# Patient Record
Sex: Female | Born: 1955
Health system: Southern US, Community
[De-identification: ages and names within clinical notes are randomized; demographics above are authoritative.]

## PROBLEM LIST (undated history)

## (undated) DIAGNOSIS — E785 Hyperlipidemia, unspecified: Secondary | ICD-10-CM

## (undated) DIAGNOSIS — Z872 Personal history of diseases of the skin and subcutaneous tissue: Secondary | ICD-10-CM

## (undated) DIAGNOSIS — E039 Hypothyroidism, unspecified: Secondary | ICD-10-CM

## (undated) DIAGNOSIS — J302 Other seasonal allergic rhinitis: Secondary | ICD-10-CM

## (undated) DIAGNOSIS — D369 Benign neoplasm, unspecified site: Secondary | ICD-10-CM

## (undated) DIAGNOSIS — M8580 Other specified disorders of bone density and structure, unspecified site: Secondary | ICD-10-CM

## (undated) DIAGNOSIS — I8390 Asymptomatic varicose veins of unspecified lower extremity: Secondary | ICD-10-CM

## (undated) DIAGNOSIS — H40039 Anatomical narrow angle, unspecified eye: Secondary | ICD-10-CM

---

## 2004-08-19 ENCOUNTER — Ambulatory Visit: Payer: Self-pay | Admitting: Family Medicine

## 2006-05-22 ENCOUNTER — Ambulatory Visit: Payer: Self-pay

## 2006-07-06 ENCOUNTER — Ambulatory Visit: Payer: Self-pay | Admitting: Family Medicine

## 2006-07-13 ENCOUNTER — Ambulatory Visit: Payer: Self-pay | Admitting: Family Medicine

## 2007-07-15 ENCOUNTER — Ambulatory Visit: Payer: Self-pay

## 2008-11-05 ENCOUNTER — Ambulatory Visit: Payer: Self-pay | Admitting: Family Medicine

## 2009-11-14 ENCOUNTER — Inpatient Hospital Stay: Payer: Self-pay | Admitting: Internal Medicine

## 2011-02-07 ENCOUNTER — Other Ambulatory Visit: Payer: Self-pay | Admitting: Physician Assistant

## 2012-02-22 ENCOUNTER — Ambulatory Visit: Payer: Self-pay | Admitting: Family Medicine

## 2012-02-26 ENCOUNTER — Ambulatory Visit: Payer: Self-pay | Admitting: Family Medicine

## 2012-04-11 ENCOUNTER — Ambulatory Visit: Payer: Self-pay | Admitting: Unknown Physician Specialty

## 2012-04-11 HISTORY — PX: COLONOSCOPY: SHX174

## 2013-03-11 ENCOUNTER — Ambulatory Visit: Payer: Self-pay | Admitting: Family Medicine

## 2014-06-02 ENCOUNTER — Ambulatory Visit: Payer: Self-pay | Admitting: Family Medicine

## 2015-04-29 ENCOUNTER — Other Ambulatory Visit: Payer: Self-pay | Admitting: Family Medicine

## 2015-04-29 DIAGNOSIS — Z1231 Encounter for screening mammogram for malignant neoplasm of breast: Secondary | ICD-10-CM

## 2015-05-05 ENCOUNTER — Other Ambulatory Visit: Payer: Self-pay | Admitting: Family Medicine

## 2015-05-05 DIAGNOSIS — M8580 Other specified disorders of bone density and structure, unspecified site: Secondary | ICD-10-CM

## 2015-05-20 ENCOUNTER — Ambulatory Visit: Payer: Self-pay

## 2015-06-04 ENCOUNTER — Ambulatory Visit: Payer: Self-pay

## 2015-06-08 ENCOUNTER — Ambulatory Visit
Admission: RE | Admit: 2015-06-08 | Discharge: 2015-06-08 | Disposition: A | Discharge status: Home / Self Care | Payer: 59 | Source: Ambulatory Visit | Attending: Family Medicine | Admitting: Family Medicine

## 2015-06-08 DIAGNOSIS — Z1231 Encounter for screening mammogram for malignant neoplasm of breast: Secondary | ICD-10-CM | POA: Diagnosis present

## 2015-06-08 DIAGNOSIS — Z780 Asymptomatic menopausal state: Secondary | ICD-10-CM | POA: Diagnosis not present

## 2015-06-08 DIAGNOSIS — Z13820 Encounter for screening for osteoporosis: Secondary | ICD-10-CM | POA: Insufficient documentation

## 2015-06-08 DIAGNOSIS — M8580 Other specified disorders of bone density and structure, unspecified site: Secondary | ICD-10-CM | POA: Insufficient documentation

## 2015-08-04 DIAGNOSIS — L503 Dermatographic urticaria: Secondary | ICD-10-CM | POA: Diagnosis not present

## 2015-08-04 DIAGNOSIS — L301 Dyshidrosis [pompholyx]: Secondary | ICD-10-CM | POA: Diagnosis not present

## 2015-08-04 DIAGNOSIS — L811 Chloasma: Secondary | ICD-10-CM | POA: Diagnosis not present

## 2016-04-25 DIAGNOSIS — I10 Essential (primary) hypertension: Secondary | ICD-10-CM | POA: Diagnosis not present

## 2016-04-25 DIAGNOSIS — Z13220 Encounter for screening for lipoid disorders: Secondary | ICD-10-CM | POA: Diagnosis not present

## 2016-04-28 DIAGNOSIS — E039 Hypothyroidism, unspecified: Secondary | ICD-10-CM | POA: Diagnosis not present

## 2016-04-28 DIAGNOSIS — Z0000 Encounter for general adult medical examination without abnormal findings: Secondary | ICD-10-CM | POA: Diagnosis not present

## 2016-05-09 ENCOUNTER — Other Ambulatory Visit: Payer: Self-pay | Admitting: Family Medicine

## 2016-05-09 DIAGNOSIS — Z1231 Encounter for screening mammogram for malignant neoplasm of breast: Secondary | ICD-10-CM

## 2016-06-13 ENCOUNTER — Ambulatory Visit
Admission: RE | Admit: 2016-06-13 | Discharge: 2016-06-13 | Disposition: A | Discharge status: Home / Self Care | Payer: 59 | Source: Ambulatory Visit | Attending: Family Medicine | Admitting: Family Medicine

## 2016-06-13 DIAGNOSIS — Z1231 Encounter for screening mammogram for malignant neoplasm of breast: Secondary | ICD-10-CM | POA: Insufficient documentation

## 2016-07-31 DIAGNOSIS — E039 Hypothyroidism, unspecified: Secondary | ICD-10-CM | POA: Diagnosis not present

## 2016-08-14 DIAGNOSIS — H04123 Dry eye syndrome of bilateral lacrimal glands: Secondary | ICD-10-CM | POA: Diagnosis not present

## 2016-09-20 DIAGNOSIS — H1013 Acute atopic conjunctivitis, bilateral: Secondary | ICD-10-CM | POA: Diagnosis not present

## 2017-03-21 DIAGNOSIS — H5203 Hypermetropia, bilateral: Secondary | ICD-10-CM | POA: Diagnosis not present

## 2017-04-23 DIAGNOSIS — E785 Hyperlipidemia, unspecified: Secondary | ICD-10-CM | POA: Diagnosis not present

## 2017-04-23 DIAGNOSIS — I10 Essential (primary) hypertension: Secondary | ICD-10-CM | POA: Diagnosis not present

## 2017-04-30 ENCOUNTER — Other Ambulatory Visit: Payer: Self-pay | Admitting: Family Medicine

## 2017-04-30 DIAGNOSIS — Z0000 Encounter for general adult medical examination without abnormal findings: Secondary | ICD-10-CM | POA: Diagnosis not present

## 2017-04-30 DIAGNOSIS — Z1231 Encounter for screening mammogram for malignant neoplasm of breast: Secondary | ICD-10-CM

## 2017-04-30 DIAGNOSIS — E039 Hypothyroidism, unspecified: Secondary | ICD-10-CM | POA: Diagnosis not present

## 2017-04-30 DIAGNOSIS — Z124 Encounter for screening for malignant neoplasm of cervix: Secondary | ICD-10-CM | POA: Diagnosis not present

## 2017-05-30 ENCOUNTER — Ambulatory Visit
Admission: RE | Admit: 2017-05-30 | Discharge: 2017-05-30 | Disposition: A | Discharge status: Home / Self Care | Payer: 59 | Source: Ambulatory Visit | Attending: Unknown Physician Specialty | Admitting: Unknown Physician Specialty

## 2017-05-30 ENCOUNTER — Ambulatory Visit: Payer: 59 | Admitting: Anesthesiology

## 2017-05-30 ENCOUNTER — Encounter
Admission: RE | Disposition: A | Discharge status: Home / Self Care | Payer: Self-pay | Source: Ambulatory Visit | Attending: Unknown Physician Specialty

## 2017-05-30 DIAGNOSIS — D123 Benign neoplasm of transverse colon: Secondary | ICD-10-CM | POA: Diagnosis not present

## 2017-05-30 DIAGNOSIS — Z86010 Personal history of colonic polyps: Secondary | ICD-10-CM | POA: Insufficient documentation

## 2017-05-30 DIAGNOSIS — K648 Other hemorrhoids: Secondary | ICD-10-CM | POA: Diagnosis not present

## 2017-05-30 DIAGNOSIS — E039 Hypothyroidism, unspecified: Secondary | ICD-10-CM | POA: Insufficient documentation

## 2017-05-30 DIAGNOSIS — E785 Hyperlipidemia, unspecified: Secondary | ICD-10-CM | POA: Diagnosis not present

## 2017-05-30 DIAGNOSIS — D122 Benign neoplasm of ascending colon: Secondary | ICD-10-CM | POA: Diagnosis not present

## 2017-05-30 DIAGNOSIS — Z79899 Other long term (current) drug therapy: Secondary | ICD-10-CM | POA: Insufficient documentation

## 2017-05-30 DIAGNOSIS — M8580 Other specified disorders of bone density and structure, unspecified site: Secondary | ICD-10-CM | POA: Insufficient documentation

## 2017-05-30 DIAGNOSIS — Z1211 Encounter for screening for malignant neoplasm of colon: Secondary | ICD-10-CM | POA: Diagnosis not present

## 2017-05-30 DIAGNOSIS — K635 Polyp of colon: Secondary | ICD-10-CM | POA: Diagnosis not present

## 2017-05-30 HISTORY — DX: Hypothyroidism, unspecified: E03.9

## 2017-05-30 HISTORY — PX: COLONOSCOPY WITH PROPOFOL: SHX5780

## 2017-05-30 HISTORY — DX: Hyperlipidemia, unspecified: E78.5

## 2017-05-30 HISTORY — DX: Other seasonal allergic rhinitis: J30.2

## 2017-05-30 HISTORY — DX: Other specified disorders of bone density and structure, unspecified site: M85.80

## 2017-05-30 HISTORY — DX: Asymptomatic varicose veins of unspecified lower extremity: I83.90

## 2017-05-30 SURGERY — COLONOSCOPY WITH PROPOFOL
Anesthesia: General

## 2017-05-30 MED ORDER — LIDOCAINE HCL (PF) 2 % IJ SOLN
INTRAMUSCULAR | Status: DC | PRN
  Administered 2017-05-30: 60 mg

## 2017-05-30 MED ORDER — LIDOCAINE HCL (PF) 2 % IJ SOLN
INTRAMUSCULAR | Status: AC
  Filled 2017-05-30: qty 10

## 2017-05-30 MED ORDER — MIDAZOLAM HCL 2 MG/2ML IJ SOLN
INTRAMUSCULAR | Status: AC
  Filled 2017-05-30: qty 2

## 2017-05-30 MED ORDER — LIDOCAINE HCL (PF) 1 % IJ SOLN
2.0000 mL | Freq: Once | INTRAMUSCULAR | Status: DC
Start: 2017-05-30 — End: 2017-05-30

## 2017-05-30 MED ORDER — EPHEDRINE SULFATE 50 MG/ML IJ SOLN
INTRAMUSCULAR | Status: DC | PRN
  Administered 2017-05-30 (×2): 5 mg via INTRAVENOUS
  Administered 2017-05-30: 15 mg via INTRAVENOUS

## 2017-05-30 MED ORDER — PROPOFOL 10 MG/ML IV BOLUS
INTRAVENOUS | Status: DC | PRN
  Administered 2017-05-30: 30 mg via INTRAVENOUS
  Administered 2017-05-30 (×2): 20 mg via INTRAVENOUS

## 2017-05-30 MED ORDER — SODIUM CHLORIDE 0.9 % IV SOLN
INTRAVENOUS | Status: DC
  Administered 2017-05-30: 1000 mL via INTRAVENOUS

## 2017-05-30 MED ORDER — FENTANYL CITRATE (PF) 100 MCG/2ML IJ SOLN
INTRAMUSCULAR | Status: AC
  Filled 2017-05-30: qty 2

## 2017-05-30 MED ORDER — SODIUM CHLORIDE 0.9 % IV SOLN: INTRAVENOUS | Status: DC

## 2017-05-30 MED ORDER — PHENYLEPHRINE HCL 10 MG/ML IJ SOLN
INTRAMUSCULAR | Status: DC | PRN
  Administered 2017-05-30 (×2): 100 ug via INTRAVENOUS

## 2017-05-30 MED ORDER — PROPOFOL 500 MG/50ML IV EMUL
INTRAVENOUS | Status: DC | PRN
  Administered 2017-05-30: 50 ug/kg/min via INTRAVENOUS

## 2017-05-30 MED ORDER — MIDAZOLAM HCL 5 MG/5ML IJ SOLN
INTRAMUSCULAR | Status: DC | PRN
  Administered 2017-05-30: 2 mg via INTRAVENOUS

## 2017-05-30 MED ORDER — FENTANYL CITRATE (PF) 100 MCG/2ML IJ SOLN
INTRAMUSCULAR | Status: DC | PRN
  Administered 2017-05-30 (×2): 50 ug via INTRAVENOUS

## 2017-05-30 NOTE — Anesthesia Post-op Follow-up Note (Signed)
Anesthesia QCDR form completed.        

## 2017-05-30 NOTE — Op Note (Signed)
[facility] Gastroenterology Patient Name: Erica Grant Procedure Date: 05/30/2017 9:33 AM MRN: [id number] Account #: [id number] Date of Birth: 02-03-1956 Admit Type: Outpatient Age: 61 Room: [facility] ENDO ROOM 3 Gender: Female Note Status: Finalized Procedure:            Colonoscopy Indications:          High risk colon cancer surveillance: Personal history                        of colonic polyps Providers:            Manya Silvas, MD Medicines:            Propofol per Anesthesia Complications:        No immediate complications. Procedure:            Pre-Anesthesia Assessment:                       - After reviewing the risks and benefits, the patient                        was deemed in satisfactory condition to undergo the                        procedure.                       After obtaining informed consent, the colonoscope was                        passed under direct vision. Throughout the procedure,                        the patient's blood pressure, pulse, and oxygen                        saturations were monitored continuously. The                        Colonoscope was introduced through the anus and                        advanced to the the cecum, identified by appendiceal                        orifice and ileocecal valve. The colonoscopy was                        performed without difficulty. The patient tolerated the                        procedure well. The quality of the bowel preparation                        was excellent. Findings:      A diminutive polyp was found in the ascending colon. The polyp was       sessile. The polyp was removed with a jumbo cold forceps. Resection and       retrieval were complete.      Two sessile polyps were found in the ascending colon. The polyps were       diminutive in  size. These polyps were removed with a jumbo cold forceps.       Resection and retrieval were complete.      A diminutive polyp  was found in the hepatic flexure. The polyp was       sessile. The polyp was removed with a jumbo cold forceps. Resection and       retrieval were complete.      A small polyp was found in the transverse colon. The polyp was sessile.       The polyp was removed with a hot snare. Resection and retrieval were       complete.      The exam was otherwise without abnormality. Impression:           - One diminutive polyp in the ascending colon, removed                        with a jumbo cold forceps. Resected and retrieved.                       - Two diminutive polyps in the ascending colon, removed                        with a jumbo cold forceps. Resected and retrieved.                       - One diminutive polyp at the hepatic flexure, removed                        with a jumbo cold forceps. Resected and retrieved.                       - One small polyp in the transverse colon, removed with                        a hot snare. Resected and retrieved.                       - The examination was otherwise normal. Recommendation:       - Await pathology results. Manya Silvas, MD 05/30/2017 10:19:43 AM This report has been signed electronically. Number of Addenda: 0 Note Initiated On: 05/30/2017 9:33 AM Scope Withdrawal Time: 0 hours 18 minutes 36 seconds  Total Procedure Duration: 0 hours 26 minutes 37 seconds       [facility]

## 2017-05-30 NOTE — Anesthesia Preprocedure Evaluation (Signed)
Anesthesia Evaluation  Patient identified by MRN, date of birth, ID band Patient awake    Reviewed: Allergy & Precautions, NPO status , Patient's Chart, lab work & pertinent test results  Airway Mallampati: I       Dental  (+) Teeth Intact   Pulmonary neg pulmonary ROS,    breath sounds clear to auscultation       Cardiovascular Exercise Tolerance: Good + Peripheral Vascular Disease   Rhythm:Regular     Neuro/Psych negative neurological ROS  negative psych ROS   GI/Hepatic negative GI ROS, Neg liver ROS,   Endo/Other  Hypothyroidism   Renal/GU negative Renal ROS     Musculoskeletal   Abdominal Normal abdominal exam  (+)   Peds  Hematology negative hematology ROS (+)   Anesthesia Other Findings   Reproductive/Obstetrics                             Anesthesia Physical Anesthesia Plan  ASA: I  Anesthesia Plan: General   Post-op Pain Management:    Induction: Intravenous  PONV Risk Score and Plan: Ondansetron  Airway Management Planned: Natural Airway and Nasal Cannula  Additional Equipment:   Intra-op Plan:   Post-operative Plan:   Informed Consent: I have reviewed the patients History and Physical, chart, labs and discussed the procedure including the risks, benefits and alternatives for the proposed anesthesia with the patient or authorized representative who has indicated his/her understanding and acceptance.     Plan Discussed with: CRNA  Anesthesia Plan Comments:         Anesthesia Quick Evaluation

## 2017-05-30 NOTE — Anesthesia Postprocedure Evaluation (Signed)
Anesthesia Post Note  Patient: Erica Grant  Procedure(s) Performed: COLONOSCOPY WITH PROPOFOL (N/A )  Patient location during evaluation: PACU Anesthesia Type: General Level of consciousness: awake Pain management: pain level controlled Vital Signs Assessment: post-procedure vital signs reviewed and stable Respiratory status: spontaneous breathing Cardiovascular status: stable Anesthetic complications: no     Last Vitals:  Vitals:   05/30/17 1040 05/30/17 1050  BP: 96/67 104/74  Pulse: 80 73  Resp: 18 15  Temp:    SpO2: 99% 100%    Last Pain:  Vitals:   05/30/17 1020  TempSrc: Tympanic                 VAN Grant,Erica Duarte

## 2017-05-30 NOTE — Transfer of Care (Signed)
Immediate Anesthesia Transfer of Care Note  Patient: Erica Grant  Procedure(s) Performed: COLONOSCOPY WITH PROPOFOL (N/A )  Patient Location: PACU  Anesthesia Type:General  Level of Consciousness: sedated  Airway & Oxygen Therapy: Patient Spontanous Breathing and Patient connected to nasal cannula oxygen  Post-op Assessment: Report given to RN and Post -op Vital signs reviewed and stable  Post vital signs: Reviewed and stable  Last Vitals:  Vitals:   05/30/17 0831  BP: 104/69  Pulse: 81  Resp: 17  Temp: 36.4 C  SpO2: 100%    Last Pain:  Vitals:   05/30/17 0831  TempSrc: Tympanic         Complications: No apparent anesthesia complications

## 2017-05-30 NOTE — H&P (Signed)
   Primary Care Physician:  Patient, No Pcp Per Primary Gastroenterologist:  Dr. Vira Agar  Pre-Procedure History & Physical: HPI:  Erica Grant is a 61 y.o. female is here for an colonoscopy.   Past Medical History:  Diagnosis Date  . Hyperlipidemia   . Hypothyroidism   . Osteopenia   . Seasonal allergies   . Varicose veins of lower extremity     No past surgical history on file.  Prior to Admission medications   Medication Sig Start Date End Date Taking? Authorizing Provider  Cholecalciferol 10000 units TABS Take by mouth daily.   Yes [provider]  Cinnamon Bark POWD 1 capsule by Does not apply route daily.   Yes [provider]  levothyroxine (SYNTHROID, LEVOTHROID) 100 MCG tablet Take 100 mcg by mouth daily before breakfast.   Yes [provider]  OMEGA-3 FATTY ACIDS PO Take 1 tablet by mouth.   Yes [provider]    Allergies as of 03/13/2017  . (Not on File)    No family history on file.  Social History   Socioeconomic History  . Marital status: Married    Spouse name: Not on file  . Number of children: Not on file  . Years of education: Not on file  . Highest education level: Not on file  Social Needs  . Financial resource strain: Not on file  . Food insecurity - worry: Not on file  . Food insecurity - inability: Not on file  . Transportation needs - medical: Not on file  . Transportation needs - non-medical: Not on file  Occupational History  . Not on file  Tobacco Use  . Smoking status: Not on file  Substance and Sexual Activity  . Alcohol use: Not on file  . Drug use: Not on file  . Sexual activity: Not on file  Other Topics Concern  . Not on file  Social History Narrative  . Not on file    Review of Systems: See HPI, otherwise negative ROS  Physical Exam: BP 104/69   Pulse 81   Temp 97.6 F (36.4 C) (Tympanic)   Resp 17   Ht 5\' 1"  (1.549 m)   Wt 67.1 kg (148 lb)   SpO2 100%   BMI 27.96 kg/m   General:   Alert,  pleasant and cooperative in NAD Head:  Normocephalic and atraumatic. Neck:  Supple; no masses or thyromegaly. Lungs:  Clear throughout to auscultation.    Heart:  Regular rate and rhythm. Abdomen:  Soft, nontender and nondistended. Normal bowel sounds, without guarding, and without rebound.   Neurologic:  Alert and  oriented x4;  grossly normal neurologically.  Impression/Plan: Erica Grant is here for an colonoscopy to be performed for [facility] colon polyps.  Risks, benefits, limitations, and alternatives regarding  colonoscopy have been reviewed with the patient.  Questions have been answered.  All parties agreeable.   Gaylyn Cheers, MD  05/30/2017, 9:36 AM

## 2017-05-31 ENCOUNTER — Encounter: Payer: Self-pay | Admitting: Unknown Physician Specialty

## 2017-06-01 LAB — SURGICAL PATHOLOGY

## 2017-06-15 ENCOUNTER — Ambulatory Visit
Admission: RE | Admit: 2017-06-15 | Discharge: 2017-06-15 | Disposition: A | Discharge status: Home / Self Care | Payer: 59 | Source: Ambulatory Visit | Attending: Family Medicine | Admitting: Family Medicine

## 2017-06-15 DIAGNOSIS — Z1231 Encounter for screening mammogram for malignant neoplasm of breast: Secondary | ICD-10-CM | POA: Diagnosis not present

## 2017-07-10 ENCOUNTER — Encounter: Payer: Self-pay | Admitting: Family

## 2017-07-10 ENCOUNTER — Ambulatory Visit (INDEPENDENT_AMBULATORY_CARE_PROVIDER_SITE_OTHER): Payer: Self-pay | Admitting: Family

## 2017-07-10 VITALS — BP 110/66 | HR 80 | Temp 98.3°F | Wt 150.0 lb

## 2017-07-10 DIAGNOSIS — R05 Cough: Secondary | ICD-10-CM

## 2017-07-10 DIAGNOSIS — J069 Acute upper respiratory infection, unspecified: Secondary | ICD-10-CM

## 2017-07-10 DIAGNOSIS — R059 Cough, unspecified: Secondary | ICD-10-CM

## 2017-07-10 LAB — POCT INFLUENZA A/B
Influenza A, POC: NEGATIVE
Influenza B, POC: NEGATIVE

## 2017-07-10 NOTE — Progress Notes (Signed)
Subjective:     Patient ID: Erica Grant, female   DOB: May 26, 1956, 62 y.o.   MRN: [id number]  HPI 62 year old female is in with c/o cough, congestion after traveling Saturday. She feels better today but wants a flu test to be sure she does not have the flu before she goes back to work.  Review of Systems  Constitutional: Negative.  Negative for chills and fatigue.  HENT: Positive for congestion and postnasal drip. Negative for sinus pressure and sinus pain.   Eyes: Positive for photophobia.  Respiratory: Positive for cough. Negative for shortness of breath and wheezing.   Cardiovascular: Negative.   Gastrointestinal: Negative.   Endocrine: Negative.   Musculoskeletal: Negative.   Allergic/Immunologic: Negative.   Neurological: Negative.   Psychiatric/Behavioral: Negative.    Past Medical History:  Diagnosis Date  . Hyperlipidemia   . Hypothyroidism   . Osteopenia   . Seasonal allergies   . Varicose veins of lower extremity     Social History   Socioeconomic History  . Marital status: Married    Spouse name: Not on file  . Number of children: Not on file  . Years of education: Not on file  . Highest education level: Not on file  Social Needs  . Financial resource strain: Not on file  . Food insecurity - worry: Not on file  . Food insecurity - inability: Not on file  . Transportation needs - medical: Not on file  . Transportation needs - non-medical: Not on file  Occupational History  . Not on file  Tobacco Use  . Smoking status: Not on file  Substance and Sexual Activity  . Alcohol use: Not on file  . Drug use: Not on file  . Sexual activity: Not on file  Other Topics Concern  . Not on file  Social History Narrative  . Not on file    Past Surgical History:  Procedure Laterality Date  . COLONOSCOPY WITH PROPOFOL N/A 05/30/2017   Procedure: COLONOSCOPY WITH PROPOFOL;  Surgeon: Manya Silvas, MD;  Location: [facility] ENDOSCOPY;  Service: Endoscopy;   Laterality: N/A;    No family history on file.  No Known Allergies  Current Outpatient Medications on File Prior to Visit  Medication Sig Dispense Refill  . levothyroxine (SYNTHROID, LEVOTHROID) 100 MCG tablet Take 100 mcg by mouth daily before breakfast.    . OMEGA-3 FATTY ACIDS PO Take 1 tablet by mouth.    . Cholecalciferol 10000 units TABS Take by mouth daily.    Verneita Griffes Bark POWD 1 capsule by Does not apply route daily.     No current facility-administered medications on file prior to visit.     BP 110/66   Pulse 80   Temp 98.3 F (36.8 C)   Wt 150 lb (68 kg)   SpO2 97%   BMI 28.34 kg/m chart    Objective:   Physical Exam  Constitutional: She is oriented to person, place, and time. She appears well-developed and well-nourished.  HENT:  Right Ear: External ear normal.  Left Ear: External ear normal.  Nose: Nose normal.  Mouth/Throat: Oropharynx is clear and moist.  Neck: Normal range of motion. Neck supple.  Cardiovascular: Normal rate, regular rhythm and normal heart sounds.  Pulmonary/Chest: Effort normal and breath sounds normal.  Musculoskeletal: Normal range of motion.  Neurological: She is alert and oriented to person, place, and time.  Skin: Skin is warm and dry.  Psychiatric: She has a normal mood and affect.  Assessment:     Derek was seen today for choice-ha-cough x3d.  Diagnoses and all orders for this visit:  Cough -     POCT Influenza A/B  Viral upper respiratory tract infection      Plan:     Call the office with any questions or concerns.

## 2017-07-10 NOTE — Patient Instructions (Signed)
Upper Respiratory Infection, Adult Most upper respiratory infections (URIs) are a viral infection of the air passages leading to the lungs. A URI affects the nose, throat, and upper air passages. The most common type of URI is nasopharyngitis and is typically referred to as "the common cold." URIs run their course and usually go away on their own. Most of the time, a URI does not require medical attention, but sometimes a bacterial infection in the upper airways can follow a viral infection. This is called a secondary infection. Sinus and middle ear infections are common types of secondary upper respiratory infections. Bacterial pneumonia can also complicate a URI. A URI can worsen asthma and chronic obstructive pulmonary disease (COPD). Sometimes, these complications can require emergency medical care and may be life threatening. What are the causes? Almost all URIs are caused by viruses. A virus is a type of germ and can spread from one person to another. What increases the risk? You may be at risk for a URI if:  You smoke.  You have chronic heart or lung disease.  You have a weakened defense (immune) system.  You are very young or very old.  You have nasal allergies or asthma.  You work in crowded or poorly ventilated areas.  You work in health care facilities or schools.  What are the signs or symptoms? Symptoms typically develop 2-3 days after you come in contact with a cold virus. Most viral URIs last 7-10 days. However, viral URIs from the influenza virus (flu virus) can last 14-18 days and are typically more severe. Symptoms may include:  Runny or stuffy (congested) nose.  Sneezing.  Cough.  Sore throat.  Headache.  Fatigue.  Fever.  Loss of appetite.  Pain in your forehead, behind your eyes, and over your cheekbones (sinus pain).  Muscle aches.  How is this diagnosed? Your health care provider may diagnose a URI by:  Physical exam.  Tests to check that your  symptoms are not due to another condition such as: ? Strep throat. ? Sinusitis. ? Pneumonia. ? Asthma.  How is this treated? A URI goes away on its own with time. It cannot be cured with medicines, but medicines may be prescribed or recommended to relieve symptoms. Medicines may help:  Reduce your fever.  Reduce your cough.  Relieve nasal congestion.  Follow these instructions at home:  Take medicines only as directed by your health care provider.  Gargle warm saltwater or take cough drops to comfort your throat as directed by your health care provider.  Use a warm mist humidifier or inhale steam from a shower to increase air moisture. This may make it easier to breathe.  Drink enough fluid to keep your urine clear or pale yellow.  Eat soups and other clear broths and maintain good nutrition.  Rest as needed.  Return to work when your temperature has returned to normal or as your health care provider advises. You may need to stay home longer to avoid infecting others. You can also use a face mask and careful hand washing to prevent spread of the virus.  Increase the usage of your inhaler if you have asthma.  Do not use any tobacco products, including cigarettes, chewing tobacco, or electronic cigarettes. If you need help quitting, ask your health care provider. How is this prevented? The best way to protect yourself from getting a cold is to practice good hygiene.  Avoid oral or hand contact with people with cold symptoms.  Wash your   hands often if contact occurs.  There is no clear evidence that vitamin C, vitamin E, echinacea, or exercise reduces the chance of developing a cold. However, it is always recommended to get plenty of rest, exercise, and practice good nutrition. Contact a health care provider if:  You are getting worse rather than better.  Your symptoms are not controlled by medicine.  You have chills.  You have worsening shortness of breath.  You have  brown or red mucus.  You have yellow or brown nasal discharge.  You have pain in your face, especially when you bend forward.  You have a fever.  You have swollen neck glands.  You have pain while swallowing.  You have white areas in the back of your throat. Get help right away if:  You have severe or persistent: ? Headache. ? Ear pain. ? Sinus pain. ? Chest pain.  You have chronic lung disease and any of the following: ? Wheezing. ? Prolonged cough. ? Coughing up blood. ? A change in your usual mucus.  You have a stiff neck.  You have changes in your: ? Vision. ? Hearing. ? Thinking. ? Mood. This information is not intended to replace advice given to you by your health care provider. Make sure you discuss any questions you have with your health care provider. Document Released: 11/22/2000 Document Revised: 01/30/2016 Document Reviewed: 09/03/2013 Elsevier Interactive Patient Education  2018 Elsevier Inc.  

## 2017-08-22 DIAGNOSIS — E039 Hypothyroidism, unspecified: Secondary | ICD-10-CM | POA: Diagnosis not present

## 2018-04-24 DIAGNOSIS — I10 Essential (primary) hypertension: Secondary | ICD-10-CM | POA: Diagnosis not present

## 2018-04-24 DIAGNOSIS — E785 Hyperlipidemia, unspecified: Secondary | ICD-10-CM | POA: Diagnosis not present

## 2018-04-24 DIAGNOSIS — E039 Hypothyroidism, unspecified: Secondary | ICD-10-CM | POA: Diagnosis not present

## 2018-05-01 DIAGNOSIS — Z0000 Encounter for general adult medical examination without abnormal findings: Secondary | ICD-10-CM | POA: Diagnosis not present

## 2018-05-30 ENCOUNTER — Other Ambulatory Visit: Payer: Self-pay | Admitting: Family Medicine

## 2018-05-30 DIAGNOSIS — Z1231 Encounter for screening mammogram for malignant neoplasm of breast: Secondary | ICD-10-CM

## 2018-06-26 ENCOUNTER — Ambulatory Visit
Admission: RE | Admit: 2018-06-26 | Discharge: 2018-06-26 | Disposition: A | Discharge status: Home / Self Care | Payer: 59 | Source: Ambulatory Visit | Attending: Family Medicine | Admitting: Family Medicine

## 2018-06-26 DIAGNOSIS — Z1231 Encounter for screening mammogram for malignant neoplasm of breast: Secondary | ICD-10-CM | POA: Diagnosis not present

## 2018-11-13 MED FILL — LEVOTHYROXINE 100 MCG TAB: 100 | 90 days supply | Qty: 90 | Fill #0

## 2019-02-19 DIAGNOSIS — L259 Unspecified contact dermatitis, unspecified cause: Secondary | ICD-10-CM | POA: Diagnosis not present

## 2019-05-05 DIAGNOSIS — Z13220 Encounter for screening for lipoid disorders: Secondary | ICD-10-CM | POA: Diagnosis not present

## 2019-05-05 DIAGNOSIS — Z0000 Encounter for general adult medical examination without abnormal findings: Secondary | ICD-10-CM | POA: Diagnosis not present

## 2019-05-05 DIAGNOSIS — E039 Hypothyroidism, unspecified: Secondary | ICD-10-CM | POA: Diagnosis not present

## 2019-05-12 ENCOUNTER — Other Ambulatory Visit: Payer: Self-pay | Admitting: Family Medicine

## 2019-05-15 DIAGNOSIS — Z23 Encounter for immunization: Secondary | ICD-10-CM | POA: Diagnosis not present

## 2019-05-15 DIAGNOSIS — Z0000 Encounter for general adult medical examination without abnormal findings: Secondary | ICD-10-CM | POA: Diagnosis not present

## 2019-05-15 DIAGNOSIS — N390 Urinary tract infection, site not specified: Secondary | ICD-10-CM | POA: Diagnosis not present

## 2019-05-23 ENCOUNTER — Other Ambulatory Visit: Payer: Self-pay | Admitting: Family Medicine

## 2019-05-23 DIAGNOSIS — Z1231 Encounter for screening mammogram for malignant neoplasm of breast: Secondary | ICD-10-CM

## 2019-06-18 DIAGNOSIS — L309 Dermatitis, unspecified: Secondary | ICD-10-CM | POA: Diagnosis not present

## 2019-07-01 DIAGNOSIS — L309 Dermatitis, unspecified: Secondary | ICD-10-CM | POA: Diagnosis not present

## 2019-07-02 ENCOUNTER — Ambulatory Visit
Admission: RE | Admit: 2019-07-02 | Discharge: 2019-07-02 | Disposition: A | Discharge status: Home / Self Care | Payer: 59 | Source: Ambulatory Visit | Attending: Family Medicine | Admitting: Family Medicine

## 2019-07-02 DIAGNOSIS — Z1231 Encounter for screening mammogram for malignant neoplasm of breast: Secondary | ICD-10-CM | POA: Diagnosis not present

## 2019-07-03 DIAGNOSIS — L239 Allergic contact dermatitis, unspecified cause: Secondary | ICD-10-CM | POA: Diagnosis not present

## 2019-07-07 DIAGNOSIS — L239 Allergic contact dermatitis, unspecified cause: Secondary | ICD-10-CM | POA: Diagnosis not present

## 2019-08-17 ENCOUNTER — Ambulatory Visit: Payer: 59 | Attending: Internal Medicine

## 2019-08-17 DIAGNOSIS — Z23 Encounter for immunization: Secondary | ICD-10-CM | POA: Insufficient documentation

## 2019-08-17 NOTE — Progress Notes (Signed)
   Covid-19 Vaccination Clinic  Name:  Erica Grant    MRN: [id number] DOB: 1955/12/12  08/17/2019  Ms. Santin was observed post Covid-19 immunization for 15 minutes without incident. She was provided with Vaccine Information Sheet and instruction to access the V-Safe system.   Ms. Jeske was instructed to call 911 with any severe reactions post vaccine: Marland Kitchen Difficulty breathing  . Swelling of face and throat  . A fast heartbeat  . A bad rash all over body  . Dizziness and weakness   Immunizations Administered    Name Date Dose VIS Date Route   Pfizer COVID-19 Vaccine 08/17/2019  4:58 PM 0.3 mL 05/23/2019 Intramuscular   Manufacturer: [place]   Lot: [id number]   [place]: [id number]

## 2019-09-08 ENCOUNTER — Ambulatory Visit: Payer: 59

## 2019-09-09 ENCOUNTER — Ambulatory Visit: Payer: 59 | Attending: Internal Medicine

## 2019-09-09 DIAGNOSIS — Z23 Encounter for immunization: Secondary | ICD-10-CM

## 2019-09-09 NOTE — Progress Notes (Signed)
   Covid-19 Vaccination Clinic  Name:  Erica Grant    MRN: [id number] DOB: 1956-05-08  09/09/2019  Ms. Villegas was observed post Covid-19 immunization for 15 minutes without incident. She was provided with Vaccine Information Sheet and instruction to access the V-Safe system.   Ms. Richerson was instructed to call 911 with any severe reactions post vaccine: Marland Kitchen Difficulty breathing  . Swelling of face and throat  . A fast heartbeat  . A bad rash all over body  . Dizziness and weakness   Immunizations Administered    Name Date Dose VIS Date Route   Pfizer COVID-19 Vaccine 09/09/2019 10:43 AM 0.3 mL 05/23/2019 Intramuscular   Manufacturer: [place]   Lot: [id number]   [place]: [id number]

## 2019-11-03 DIAGNOSIS — L811 Chloasma: Secondary | ICD-10-CM | POA: Diagnosis not present

## 2019-11-03 DIAGNOSIS — L089 Local infection of the skin and subcutaneous tissue, unspecified: Secondary | ICD-10-CM | POA: Diagnosis not present

## 2019-11-03 DIAGNOSIS — L239 Allergic contact dermatitis, unspecified cause: Secondary | ICD-10-CM | POA: Diagnosis not present

## 2020-03-23 DIAGNOSIS — N390 Urinary tract infection, site not specified: Secondary | ICD-10-CM | POA: Diagnosis not present

## 2020-03-24 ENCOUNTER — Other Ambulatory Visit: Payer: Self-pay | Admitting: Family Medicine

## 2020-04-22 ENCOUNTER — Other Ambulatory Visit: Payer: Self-pay | Admitting: Dermatology

## 2020-04-22 DIAGNOSIS — L811 Chloasma: Secondary | ICD-10-CM | POA: Diagnosis not present

## 2020-04-22 DIAGNOSIS — L815 Leukoderma, not elsewhere classified: Secondary | ICD-10-CM | POA: Diagnosis not present

## 2020-04-22 DIAGNOSIS — L218 Other seborrheic dermatitis: Secondary | ICD-10-CM | POA: Diagnosis not present

## 2020-05-25 ENCOUNTER — Other Ambulatory Visit: Payer: Self-pay | Admitting: Family Medicine

## 2020-05-25 DIAGNOSIS — Z1231 Encounter for screening mammogram for malignant neoplasm of breast: Secondary | ICD-10-CM

## 2020-05-26 ENCOUNTER — Other Ambulatory Visit: Payer: Self-pay | Admitting: Gastroenterology

## 2020-05-26 DIAGNOSIS — Z86010 Personal history of colonic polyps: Secondary | ICD-10-CM | POA: Diagnosis not present

## 2020-06-10 ENCOUNTER — Other Ambulatory Visit: Payer: Self-pay

## 2020-06-10 ENCOUNTER — Other Ambulatory Visit
Admission: RE | Admit: 2020-06-10 | Discharge: 2020-06-10 | Disposition: A | Discharge status: Home / Self Care | Payer: 59 | Source: Ambulatory Visit | Attending: Gastroenterology | Admitting: Gastroenterology

## 2020-06-10 DIAGNOSIS — Z20822 Contact with and (suspected) exposure to covid-19: Secondary | ICD-10-CM | POA: Insufficient documentation

## 2020-06-10 DIAGNOSIS — Z01812 Encounter for preprocedural laboratory examination: Secondary | ICD-10-CM | POA: Insufficient documentation

## 2020-06-10 LAB — SARS CORONAVIRUS 2 (TAT 6-24 HRS): SARS Coronavirus 2: NEGATIVE

## 2020-06-14 ENCOUNTER — Ambulatory Visit
Admission: RE | Admit: 2020-06-14 | Discharge: 2020-06-14 | Disposition: A | Discharge status: Home / Self Care | Payer: 59 | Source: Ambulatory Visit | Attending: Gastroenterology | Admitting: Gastroenterology

## 2020-06-14 ENCOUNTER — Other Ambulatory Visit: Payer: Self-pay

## 2020-06-14 ENCOUNTER — Ambulatory Visit: Payer: 59 | Admitting: Certified Registered Nurse Anesthetist

## 2020-06-14 ENCOUNTER — Encounter
Admission: RE | Disposition: A | Discharge status: Home / Self Care | Payer: Self-pay | Source: Ambulatory Visit | Attending: Gastroenterology

## 2020-06-14 DIAGNOSIS — Z9109 Other allergy status, other than to drugs and biological substances: Secondary | ICD-10-CM | POA: Insufficient documentation

## 2020-06-14 DIAGNOSIS — Z86010 Personal history of colonic polyps: Secondary | ICD-10-CM | POA: Insufficient documentation

## 2020-06-14 DIAGNOSIS — Z1211 Encounter for screening for malignant neoplasm of colon: Secondary | ICD-10-CM | POA: Insufficient documentation

## 2020-06-14 DIAGNOSIS — Z79899 Other long term (current) drug therapy: Secondary | ICD-10-CM | POA: Insufficient documentation

## 2020-06-14 HISTORY — DX: Anatomical narrow angle, unspecified eye: H40.039

## 2020-06-14 HISTORY — DX: Personal history of diseases of the skin and subcutaneous tissue: Z87.2

## 2020-06-14 HISTORY — PX: COLONOSCOPY WITH PROPOFOL: SHX5780

## 2020-06-14 SURGERY — COLONOSCOPY WITH PROPOFOL
Anesthesia: General

## 2020-06-14 MED ORDER — LIDOCAINE HCL (PF) 2 % IJ SOLN
INTRAMUSCULAR | Status: AC
  Filled 2020-06-14: qty 5

## 2020-06-14 MED ORDER — PROPOFOL 10 MG/ML IV BOLUS
INTRAVENOUS | Status: DC | PRN
  Administered 2020-06-14: 50 mg via INTRAVENOUS

## 2020-06-14 MED ORDER — PROPOFOL 500 MG/50ML IV EMUL
INTRAVENOUS | Status: AC
  Filled 2020-06-14: qty 50

## 2020-06-14 MED ORDER — LIDOCAINE HCL (CARDIAC) PF 100 MG/5ML IV SOSY
PREFILLED_SYRINGE | INTRAVENOUS | Status: DC | PRN
  Administered 2020-06-14: 50 mg via INTRAVENOUS

## 2020-06-14 MED ORDER — PROPOFOL 500 MG/50ML IV EMUL
INTRAVENOUS | Status: DC | PRN
  Administered 2020-06-14: 150 ug/kg/min via INTRAVENOUS

## 2020-06-14 MED ORDER — SODIUM CHLORIDE 0.9 % IV SOLN: INTRAVENOUS | Status: DC

## 2020-06-14 NOTE — Anesthesia Postprocedure Evaluation (Signed)
Anesthesia Post Note  Patient: Erica Grant  Procedure(s) Performed: COLONOSCOPY WITH PROPOFOL (N/A )  Patient location during evaluation: Endoscopy Anesthesia Type: General Level of consciousness: awake and alert Pain management: pain level controlled Vital Signs Assessment: post-procedure vital signs reviewed and stable Respiratory status: spontaneous breathing, nonlabored ventilation, respiratory function stable and patient connected to nasal cannula oxygen Cardiovascular status: blood pressure returned to baseline and stable Postop Assessment: no apparent nausea or vomiting Anesthetic complications: no   No complications documented.   Last Vitals:  Vitals:   06/14/20 0937 06/14/20 1047  BP: (!) 112/98   Pulse: 84 72  Resp: 16 18  Temp: (!) 36.4 C 36.6 C  SpO2: 100% 97%    Last Pain:  Vitals:   06/14/20 1047  TempSrc: Temporal  PainSc: Asleep                 Corinda Gubler

## 2020-06-14 NOTE — Anesthesia Preprocedure Evaluation (Signed)
Anesthesia Evaluation  Patient identified by MRN, date of birth, ID band Patient awake    Reviewed: Allergy & Precautions, NPO status , Patient's Chart, lab work & pertinent test results  History of Anesthesia Complications Negative for: history of anesthetic complications  Airway Mallampati: II  TM Distance: >3 FB Neck ROM: Full    Dental no notable dental hx. (+) Teeth Intact   Pulmonary neg pulmonary ROS, neg sleep apnea, neg COPD, Patient abstained from smoking.Not current smoker, former smoker,    Pulmonary exam normal breath sounds clear to auscultation       Cardiovascular Exercise Tolerance: Good METS(-) hypertension(-) CAD and (-) Past MI negative cardio ROS  (-) dysrhythmias  Rhythm:Regular Rate:Normal - Systolic murmurs    Neuro/Psych negative neurological ROS  negative psych ROS   GI/Hepatic neg GERD  ,(+)     (-) substance abuse  ,   Endo/Other  neg diabetesHypothyroidism   Renal/GU negative Renal ROS     Musculoskeletal   Abdominal   Peds  Hematology   Anesthesia Other Findings Past Medical History: No date: H/O urticaria No date: Hyperlipidemia No date: Hypothyroidism No date: Narrow angle glaucoma suspect No date: Osteopenia No date: Seasonal allergies No date: Varicose veins of lower extremity  Reproductive/Obstetrics                             Anesthesia Physical Anesthesia Plan  ASA: II  Anesthesia Plan: General   Post-op Pain Management:    Induction: Intravenous  PONV Risk Score and Plan: 3 and Ondansetron, Propofol infusion and TIVA  Airway Management Planned: Nasal Cannula  Additional Equipment: None  Intra-op Plan:   Post-operative Plan:   Informed Consent: I have reviewed the patients History and Physical, chart, labs and discussed the procedure including the risks, benefits and alternatives for the proposed anesthesia with the patient or  authorized representative who has indicated his/her understanding and acceptance.     Dental advisory given  Plan Discussed with: CRNA and Surgeon  Anesthesia Plan Comments: (Discussed risks of anesthesia with patient, including possibility of difficulty with spontaneous ventilation under anesthesia necessitating airway intervention, PONV, and rare risks such as cardiac or respiratory or neurological events. Patient understands.)        Anesthesia Quick Evaluation

## 2020-06-14 NOTE — Interval H&P Note (Signed)
History and Physical Interval Note:  06/14/2020 10:14 AM  [name]  has presented today for surgery, with the diagnosis of p hx polyps.  The various methods of treatment have been discussed with the patient and family. After consideration of risks, benefits and other options for treatment, the patient has consented to  Procedure(s): COLONOSCOPY WITH PROPOFOL (N/A) as a surgical intervention.  The patient's history has been reviewed, patient examined, no change in status, stable for surgery.  I have reviewed the patient's chart and labs.  Questions were answered to the patient's satisfaction.     Regis Bill  Ok to proceed with colonoscopy

## 2020-06-14 NOTE — Transfer of Care (Signed)
Immediate Anesthesia Transfer of Care Note  Patient: Erica Grant  Procedure(s) Performed: COLONOSCOPY WITH PROPOFOL (N/A )  Patient Location: Endoscopy Unit  Anesthesia Type:General  Level of Consciousness: drowsy  Airway & Oxygen Therapy: Patient Spontanous Breathing  Post-op Assessment: Report given to RN and Post -op Vital signs reviewed and stable  Post vital signs: Reviewed and stable  Last Vitals:  Vitals Value Taken Time  BP 85/56 06/14/20 1049  Temp 36.6 C 06/14/20 1047  Pulse 73 06/14/20 1050  Resp 16 06/14/20 1050  SpO2 97 % 06/14/20 1050  Vitals shown include unvalidated device data.  Last Pain:  Vitals:   06/14/20 1047  TempSrc: Temporal  PainSc: Asleep         Complications: No complications documented.

## 2020-06-14 NOTE — Op Note (Signed)
[facility] Gastroenterology Patient Name: Naomia Lenderman Procedure Date: 06/14/2020 10:07 AM MRN: [id number] Account #: [id number] Date of Birth: September 19, 1955 Admit Type: Outpatient Age: 65 Room: [facility] ENDO ROOM 3 Gender: Female Note Status: Finalized Procedure:             Colonoscopy Indications:           High risk colon cancer surveillance: Personal history                         of multiple (3 or more) adenomas Providers:             Andrey Farmer MD, MD Medicines:             Monitored Anesthesia Care Complications:         No immediate complications. Procedure:             Pre-Anesthesia Assessment:                        - Prior to the procedure, a History and Physical was                         performed, and patient medications and allergies were                         reviewed. The patient is competent. The risks and                         benefits of the procedure and the sedation options and                         risks were discussed with the patient. All questions                         were answered and informed consent was obtained.                         Patient identification and proposed procedure were                         verified by the physician, the nurse, the anesthetist                         and the technician in the endoscopy suite. Mental                         Status Examination: alert and oriented. Airway                         Examination: normal oropharyngeal airway and neck                         mobility. Respiratory Examination: clear to                         auscultation. CV Examination: normal. Prophylactic                         Antibiotics: The patient does not require prophylactic  antibiotics. Prior Anticoagulants: The patient has                         taken no previous anticoagulant or antiplatelet                         agents. ASA Grade Assessment: II - A patient with mild                          systemic disease. After reviewing the risks and                         benefits, the patient was deemed in satisfactory                         condition to undergo the procedure. The anesthesia                         plan was to use monitored anesthesia care (MAC).                         Immediately prior to administration of medications,                         the patient was re-assessed for adequacy to receive                         sedatives. The heart rate, respiratory rate, oxygen                         saturations, blood pressure, adequacy of pulmonary                         ventilation, and response to care were monitored                         throughout the procedure. The physical status of the                         patient was re-assessed after the procedure.                        After obtaining informed consent, the colonoscope was                         passed under direct vision. Throughout the procedure,                         the patient's blood pressure, pulse, and oxygen                         saturations were monitored continuously. The                         Colonoscope was introduced through the anus and                         advanced to the the cecum, identified by appendiceal  orifice and ileocecal valve. The colonoscopy was                         performed without difficulty. The patient tolerated                         the procedure well. The quality of the bowel                         preparation was good. Findings:      The perianal and digital rectal examinations were normal.      The colon (entire examined portion) appeared normal.      The exam was otherwise without abnormality on direct and retroflexion       views. Impression:            - The entire examined colon is normal.                        - The examination was otherwise normal on direct and                         retroflexion  views.                        - No specimens collected. Recommendation:        - Discharge patient to home.                        - Resume previous diet.                        - Continue present medications.                        - Repeat colonoscopy in 5 years for surveillance.                        - Return to referring physician as previously                         scheduled. Procedure Code(s):     --- Professional ---                        P4982, Colorectal cancer screening; colonoscopy on                         individual at high risk Diagnosis Code(s):     --- Professional ---                        Z86.010, Personal history of colonic polyps CPT copyright 2019 American [employer] Association. All rights reserved. The codes documented in this report are preliminary and upon coder review may  be revised to meet current compliance requirements. Andrey Farmer, MD Andrey Farmer MD, MD 06/14/2020 10:47:41 AM Number of Addenda: 0 Note Initiated On: 06/14/2020 10:07 AM Scope Withdrawal Time: 0 hours 10 minutes 4 seconds  Total Procedure Duration: 0 hours 18 minutes 43 seconds  Estimated Blood Loss:  Estimated blood loss: none.      [facility]

## 2020-06-14 NOTE — H&P (Signed)
Outpatient short stay form Pre-procedure 06/14/2020 10:12 AM Merlyn Lot MD, MPH  Primary Physician:  Dr. Terance Hart  Reason for visit:  Surveillance colonoscopy  History of present illness:   65 y/o lady with history of 3 TA's here for surveillance colonoscopy. No blood thinners, abdominal surgeries, or family history of GI malignancies. No new GI symptoms.    Current Facility-Administered Medications:  .  0.9 %  sodium chloride infusion, , Intravenous, Continuous, Steve Youngberg, Rossie Muskrat, MD, Last Rate: 20 mL/hr at 06/14/20 0953, New Bag at 06/14/20 0953  Medications Prior to Admission  Medication Sig Dispense Refill Last Dose  . Cholecalciferol 10000 units TABS Take by mouth daily.   Past Week at Unknown time  . Cinnamon Bark POWD 1 capsule by Does not apply route daily.   Past Week at Unknown time  . levothyroxine (SYNTHROID, LEVOTHROID) 100 MCG tablet Take 100 mcg by mouth daily before breakfast.   06/13/2020 at Unknown time  . OMEGA-3 FATTY ACIDS PO Take 1 tablet by mouth.   Past Week at Unknown time     Allergies  Allergen Reactions  . Nickel Rash     Past Medical History:  Diagnosis Date  . H/O urticaria   . Hyperlipidemia   . Hypothyroidism   . Narrow angle glaucoma suspect   . Osteopenia   . Seasonal allergies   . Varicose veins of lower extremity     Review of systems:  Otherwise negative.    Physical Exam  Gen: Alert, oriented. Appears stated age.  HEENT: PERRLA. Lungs: No respiratory distress CV: RRR Abd: soft, benign, no masses Ext: No edema    Planned procedures: Proceed with colonoscopy. The patient understands the nature of the planned procedure, indications, risks, alternatives and potential complications including but not limited to bleeding, infection, perforation, damage to internal organs and possible oversedation/side effects from anesthesia. The patient agrees and gives consent to proceed.  Please refer to procedure notes for findings,  recommendations and patient disposition/instructions.     Merlyn Lot MD, MPH Gastroenterology 06/14/2020  10:12 AM

## 2020-06-16 ENCOUNTER — Encounter: Payer: Self-pay | Admitting: Gastroenterology

## 2020-06-24 DIAGNOSIS — Z0000 Encounter for general adult medical examination without abnormal findings: Secondary | ICD-10-CM | POA: Diagnosis not present

## 2020-06-24 DIAGNOSIS — E039 Hypothyroidism, unspecified: Secondary | ICD-10-CM | POA: Diagnosis not present

## 2020-07-02 ENCOUNTER — Ambulatory Visit
Admission: RE | Admit: 2020-07-02 | Discharge: 2020-07-02 | Disposition: A | Discharge status: Home / Self Care | Payer: 59 | Source: Ambulatory Visit | Attending: Family Medicine | Admitting: Family Medicine

## 2020-07-02 ENCOUNTER — Other Ambulatory Visit: Payer: Self-pay

## 2020-07-02 DIAGNOSIS — Z1231 Encounter for screening mammogram for malignant neoplasm of breast: Secondary | ICD-10-CM | POA: Diagnosis not present

## 2020-07-12 DIAGNOSIS — E039 Hypothyroidism, unspecified: Secondary | ICD-10-CM | POA: Diagnosis not present

## 2020-07-12 DIAGNOSIS — M8580 Other specified disorders of bone density and structure, unspecified site: Secondary | ICD-10-CM | POA: Diagnosis not present

## 2020-07-12 DIAGNOSIS — Z0000 Encounter for general adult medical examination without abnormal findings: Secondary | ICD-10-CM | POA: Diagnosis not present

## 2020-07-12 DIAGNOSIS — Z23 Encounter for immunization: Secondary | ICD-10-CM | POA: Diagnosis not present

## 2020-07-14 ENCOUNTER — Encounter: Payer: Self-pay | Admitting: Obstetrics and Gynecology

## 2020-07-30 DIAGNOSIS — M8588 Other specified disorders of bone density and structure, other site: Secondary | ICD-10-CM | POA: Diagnosis not present

## 2020-11-22 ENCOUNTER — Other Ambulatory Visit: Payer: Self-pay

## 2020-11-22 DIAGNOSIS — L239 Allergic contact dermatitis, unspecified cause: Secondary | ICD-10-CM | POA: Diagnosis not present

## 2020-11-22 DIAGNOSIS — L811 Chloasma: Secondary | ICD-10-CM | POA: Diagnosis not present

## 2020-11-22 DIAGNOSIS — S50862A Insect bite (nonvenomous) of left forearm, initial encounter: Secondary | ICD-10-CM | POA: Diagnosis not present

## 2020-11-22 DIAGNOSIS — B353 Tinea pedis: Secondary | ICD-10-CM | POA: Diagnosis not present

## 2020-11-22 DIAGNOSIS — S50861A Insect bite (nonvenomous) of right forearm, initial encounter: Secondary | ICD-10-CM | POA: Diagnosis not present

## 2020-11-22 MED ORDER — MOMETASONE FUROATE 0.1 % EX OINT
TOPICAL_OINTMENT | CUTANEOUS | 1 refills | Status: DC
  Filled 2020-11-22: qty 45, 30d supply, fill #0

## 2020-11-22 MED ORDER — ECONAZOLE NITRATE 1 % EX CREA
TOPICAL_CREAM | CUTANEOUS | 0 refills | Status: DC
  Filled 2020-11-22: qty 30, 15d supply, fill #0

## 2020-11-22 MED FILL — Levothyroxine Sodium Tab 100 MCG: ORAL | 90 days supply | Qty: 90 | Fill #0 | Status: AC

## 2020-12-17 ENCOUNTER — Other Ambulatory Visit: Payer: Self-pay

## 2021-01-12 DIAGNOSIS — M8580 Other specified disorders of bone density and structure, unspecified site: Secondary | ICD-10-CM | POA: Diagnosis not present

## 2021-01-12 DIAGNOSIS — E039 Hypothyroidism, unspecified: Secondary | ICD-10-CM | POA: Diagnosis not present

## 2021-02-21 ENCOUNTER — Other Ambulatory Visit: Payer: Self-pay

## 2021-02-21 MED FILL — Levothyroxine Sodium Tab 100 MCG: ORAL | 90 days supply | Qty: 90 | Fill #1 | Status: AC

## 2021-04-03 DIAGNOSIS — M545 Low back pain, unspecified: Secondary | ICD-10-CM | POA: Diagnosis not present

## 2021-04-11 DIAGNOSIS — U071 COVID-19: Secondary | ICD-10-CM | POA: Diagnosis not present

## 2021-04-12 ENCOUNTER — Other Ambulatory Visit: Payer: Self-pay

## 2021-04-12 MED ORDER — CARESTART COVID-19 HOME TEST VI KIT
PACK | 0 refills | Status: DC
  Filled 2021-04-12: qty 2, 4d supply, fill #0

## 2021-05-25 ENCOUNTER — Other Ambulatory Visit: Payer: Self-pay

## 2021-05-26 ENCOUNTER — Other Ambulatory Visit: Payer: Self-pay

## 2021-05-26 MED ORDER — LEVOTHYROXINE SODIUM 100 MCG PO TABS
ORAL_TABLET | ORAL | 3 refills | Status: DC
  Filled 2021-05-26: qty 90, 90d supply, fill #0

## 2021-05-26 MED ORDER — LEVOTHYROXINE SODIUM 100 MCG PO TABS
ORAL_TABLET | ORAL | 3 refills | Status: DC
  Filled 2021-05-26: qty 90, 90d supply, fill #0
  Filled 2021-08-24: qty 90, 90d supply, fill #1
  Filled 2021-11-25: qty 90, 90d supply, fill #2
  Filled 2022-03-06: qty 90, 90d supply, fill #3

## 2021-06-02 ENCOUNTER — Other Ambulatory Visit: Payer: Self-pay | Admitting: Family Medicine

## 2021-06-02 DIAGNOSIS — Z1231 Encounter for screening mammogram for malignant neoplasm of breast: Secondary | ICD-10-CM

## 2021-06-14 ENCOUNTER — Other Ambulatory Visit: Payer: Self-pay

## 2021-06-14 MED ORDER — CARESTART COVID-19 HOME TEST VI KIT
PACK | 0 refills | Status: DC
  Filled 2021-06-14 – 2021-06-30 (×2): qty 2, 4d supply, fill #0

## 2021-06-21 ENCOUNTER — Other Ambulatory Visit: Payer: Self-pay

## 2021-06-24 ENCOUNTER — Other Ambulatory Visit: Payer: Self-pay

## 2021-06-30 ENCOUNTER — Other Ambulatory Visit: Payer: Self-pay

## 2021-07-05 ENCOUNTER — Other Ambulatory Visit: Payer: Self-pay

## 2021-07-05 ENCOUNTER — Ambulatory Visit
Admission: RE | Admit: 2021-07-05 | Discharge: 2021-07-05 | Disposition: A | Discharge status: Home / Self Care | Payer: 59 | Source: Ambulatory Visit | Attending: Family Medicine | Admitting: Family Medicine

## 2021-07-05 DIAGNOSIS — Z1231 Encounter for screening mammogram for malignant neoplasm of breast: Secondary | ICD-10-CM | POA: Insufficient documentation

## 2021-07-18 DIAGNOSIS — E039 Hypothyroidism, unspecified: Secondary | ICD-10-CM | POA: Diagnosis not present

## 2021-07-18 DIAGNOSIS — Z13220 Encounter for screening for lipoid disorders: Secondary | ICD-10-CM | POA: Diagnosis not present

## 2021-07-18 DIAGNOSIS — Z0000 Encounter for general adult medical examination without abnormal findings: Secondary | ICD-10-CM | POA: Diagnosis not present

## 2021-07-25 DIAGNOSIS — E039 Hypothyroidism, unspecified: Secondary | ICD-10-CM | POA: Diagnosis not present

## 2021-07-25 DIAGNOSIS — Z0000 Encounter for general adult medical examination without abnormal findings: Secondary | ICD-10-CM | POA: Diagnosis not present

## 2021-07-25 DIAGNOSIS — Z124 Encounter for screening for malignant neoplasm of cervix: Secondary | ICD-10-CM | POA: Diagnosis not present

## 2021-07-26 ENCOUNTER — Other Ambulatory Visit: Payer: Self-pay

## 2021-07-26 MED ORDER — ZOSTER VAC RECOMB ADJUVANTED 50 MCG/0.5ML IM SUSR
0.5000 mL | Freq: Once | INTRAMUSCULAR | 2 refills | Status: AC
  Filled 2021-07-26: qty 0.5, 1d supply, fill #0
  Filled 2022-07-26 (×2): qty 0.5, 1d supply, fill #1

## 2021-08-02 ENCOUNTER — Other Ambulatory Visit: Payer: Self-pay

## 2021-08-03 ENCOUNTER — Other Ambulatory Visit: Payer: Self-pay

## 2021-08-05 ENCOUNTER — Encounter (INDEPENDENT_AMBULATORY_CARE_PROVIDER_SITE_OTHER): Payer: Self-pay | Admitting: Vascular Surgery

## 2021-08-05 ENCOUNTER — Ambulatory Visit (INDEPENDENT_AMBULATORY_CARE_PROVIDER_SITE_OTHER): Payer: 59 | Admitting: Vascular Surgery

## 2021-08-05 ENCOUNTER — Other Ambulatory Visit: Payer: Self-pay

## 2021-08-05 DIAGNOSIS — E785 Hyperlipidemia, unspecified: Secondary | ICD-10-CM | POA: Diagnosis not present

## 2021-08-05 DIAGNOSIS — I83811 Varicose veins of right lower extremities with pain: Secondary | ICD-10-CM | POA: Insufficient documentation

## 2021-08-05 NOTE — Progress Notes (Signed)
Patient ID: Erica Grant, female   DOB: Oct 07, 1955, 66 y.o.   MRN: [id number]  Chief Complaint  Patient presents with   New Patient (Initial Visit)    Consult varicose veins    HPI Erica Grant is a 66 y.o. female.  I am asked to see the patient by Dr. Terance Hart for evaluation of painful varicose veins of the right lower extremity.  The patient has a previous history of venous intervention about 9 years ago on that same leg.  For many years, they did.  Over the past year or 2, she has noticed increasing varicosities which are locally painful and tender as well as heaviness, pain, and swelling in the right lower leg.  No significant symptoms on the left.  No previous history of DVT or superficial thrombophlebitis to her knowledge.  No chest pain or shortness of breath.  She has been wearing compression socks daily for years now.  She elevates her legs and remains active and in good shape.   Past Medical History:  Diagnosis Date   H/O urticaria    Hyperlipidemia    Hypothyroidism    Narrow angle glaucoma suspect    Osteopenia    Seasonal allergies    Varicose veins of lower extremity     Past Surgical History:  Procedure Laterality Date   COLONOSCOPY WITH PROPOFOL N/A 05/30/2017   Procedure: COLONOSCOPY WITH PROPOFOL;  Surgeon: Scot Jun, MD;  Location: [facility] ENDOSCOPY;  Service: Endoscopy;  Laterality: N/A;   COLONOSCOPY WITH PROPOFOL N/A 06/14/2020   Procedure: COLONOSCOPY WITH PROPOFOL;  Surgeon: Regis Bill, MD;  Location: [facility] ENDOSCOPY;  Service: Endoscopy;  Laterality: N/A;     Family History  Problem Relation Age of Onset   Varicose Veins Mother    Heart attack Mother    Cancer Brother    Breast cancer Neg Hx       Social History   Tobacco Use   Smoking status: Former    Packs/day: 1.00    Years: 10.00    Pack years: 10.00    Types: Cigarettes    Quit date: 06/13/1999    Years since quitting: 22.1   Smokeless tobacco: Never  Vaping  Use   Vaping Use: Never used  Substance Use Topics   Alcohol use: Yes    Comment: occ.   Drug use: Never     Allergies  Allergen Reactions   Nickel Rash    Current Outpatient Medications  Medication Sig Dispense Refill   Cholecalciferol 10000 units TABS Take by mouth daily.     Cinnamon Bark POWD 1 capsule by Does not apply route daily.     COVID-19 At Home Antigen Test [facility] COVID-19 HOME TEST) KIT use as directed 2 kit 0   econazole nitrate 1 % cream Apply twice daily to rash until improved, then as needed. 30 g 0   levothyroxine (SYNTHROID) 100 MCG tablet TAKE 1 TABLET BY MOUTH EVERY MORNING (6:30AM) ON AN EMPTY STOMACH W/ GLASS OF WATER AT LEAST 30-60 MINUTES BEFORE BREAKFAST 90 tablet 3   levothyroxine (SYNTHROID) 100 MCG tablet Take 1 tablet (100 mcg total) by mouth once daily Take on an empty stomach with a glass of water at least 30-60 minutes before breakfast. 90 tablet 3   levothyroxine (SYNTHROID, LEVOTHROID) 100 MCG tablet Take 100 mcg by mouth daily before breakfast.     mometasone (ELOCON) 0.1 % ointment Apply twice daily to affected areas until improved 45 g 1  OMEGA-3 FATTY ACIDS PO Take 1 tablet by mouth. (Patient not taking: Reported on 08/05/2021)     No current facility-administered medications for this visit.      REVIEW OF SYSTEMS (Negative unless checked)  Constitutional: [] Weight loss  [] Fever  [] Chills Cardiac: [] Chest pain   [] Chest pressure   [] Palpitations   [] Shortness of breath when laying flat   [] Shortness of breath at rest   [] Shortness of breath with exertion. Vascular:  [] Pain in legs with walking   [] Pain in legs at rest   [] Pain in legs when laying flat   [] Claudication   [] Pain in feet when walking  [] Pain in feet at rest  [] Pain in feet when laying flat   [] History of DVT   [] Phlebitis   [x] Swelling in legs   [x] Varicose veins   [] Non-healing ulcers Pulmonary:   [] Uses home oxygen   [] Productive cough   [] Hemoptysis   [] Wheeze  [] COPD    [] Asthma Neurologic:  [] Dizziness  [] Blackouts   [] Seizures   [] History of stroke   [] History of TIA  [] Aphasia   [] Temporary blindness   [] Dysphagia   [] Weakness or numbness in arms   [] Weakness or numbness in legs Musculoskeletal:  [] Arthritis   [] Joint swelling   [] Joint pain   [] Low back pain Hematologic:  [] Easy bruising  [] Easy bleeding   [] Hypercoagulable state   [] Anemic  [] Hepatitis Gastrointestinal:  [] Blood in stool   [] Vomiting blood  [] Gastroesophageal reflux/heartburn   [] Abdominal pain Genitourinary:  [] Chronic kidney disease   [] Difficult urination  [] Frequent urination  [] Burning with urination   [] Hematuria Skin:  [] Rashes   [] Ulcers   [] Wounds Psychological:  [] History of anxiety   []  History of major depression.    Physical Exam BP 123/83 (BP Location: Right Arm)    Pulse 70    Resp 16    Ht 5\' 2"  (1.575 m)    Wt 160 lb (72.6 kg)    BMI 29.26 kg/m  Gen:  WD/WN, NAD.  Appears younger than stated age Head: [place]/AT, No temporalis wasting.  Ear/Nose/Throat: Hearing grossly intact, nares w/o erythema or drainage, oropharynx w/o Erythema/Exudate Eyes: Conjunctiva clear, sclera non-icteric  Neck: trachea midline.  No JVD.  Pulmonary:  Good air movement, respirations not labored, no use of accessory muscles  Cardiac: RRR, no JVD Vascular: Prominent varicosities are present in the right medial thigh and across the anterior and medial and lateral calf. Vessel Right Left  Radial Palpable Palpable                                   Gastrointestinal:. No masses, surgical incisions, or scars. Musculoskeletal: M/S 5/5 throughout.  Extremities without ischemic changes.  No deformity or atrophy.  Trace right lower extremity edema. Neurologic: Sensation grossly intact in extremities.  Symmetrical.  Speech is fluent. Motor exam as listed above. Psychiatric: Judgment intact, Mood & affect appropriate for pt's clinical situation. Dermatologic: No rashes or ulcers noted.  No  cellulitis or open wounds.    Radiology No results found.  Labs No results found for this or any previous visit (from the past 2160 hour(s)).  Assessment/Plan:  Varicose veins of lower extremity with pain, right  Recommend:  The patient has large symptomatic varicose veins that are painful and associated with swelling.  I have had a long discussion with the patient regarding  varicose veins and why they cause symptoms.  Patient will continue wearing  graduated compression stockings class 1 on a daily basis, beginning first thing in the morning and removing them in the evening. The patient is instructed specifically not to sleep in the stockings.    The patient  will also begin using over-the-counter analgesics such as Motrin 600 mg po TID to help control the symptoms.    In addition, behavioral modification including elevation during the day will be initiated.    Pending the results of duplex, follow up will be arranged.   An  ultrasound of the venous system will be obtained.   Further plans will be based on the ultrasound results and whether conservative therapies are successful at eliminating the pain and swelling.   Hyperlipidemia lipid control important in reducing the progression of atherosclerotic disease. Continue statin therapy      Leotis Pain 08/05/2021, 1:01 PM   This note was created with Dragon medical transcription system.  Any errors from dictation are unintentional.

## 2021-08-05 NOTE — Assessment & Plan Note (Signed)
lipid control important in reducing the progression of atherosclerotic disease. Continue statin therapy  

## 2021-08-05 NOTE — Assessment & Plan Note (Signed)
Recommend:  The patient has large symptomatic varicose veins that are painful and associated with swelling.  I have had a long discussion with the patient regarding  varicose veins and why they cause symptoms.  Patient will continue wearing graduated compression stockings class 1 on a daily basis, beginning first thing in the morning and removing them in the evening. The patient is instructed specifically not to sleep in the stockings.    The patient  will also begin using over-the-counter analgesics such as Motrin 600 mg po TID to help control the symptoms.    In addition, behavioral modification including elevation during the day will be initiated.    Pending the results of duplex, follow up will be arranged.   An  ultrasound of the venous system will be obtained.   Further plans will be based on the ultrasound results and whether conservative therapies are successful at eliminating the pain and swelling.

## 2021-08-09 ENCOUNTER — Ambulatory Visit (INDEPENDENT_AMBULATORY_CARE_PROVIDER_SITE_OTHER): Payer: 59 | Admitting: Nurse Practitioner

## 2021-08-09 ENCOUNTER — Other Ambulatory Visit: Payer: Self-pay

## 2021-08-09 ENCOUNTER — Encounter (INDEPENDENT_AMBULATORY_CARE_PROVIDER_SITE_OTHER): Payer: Self-pay | Admitting: Nurse Practitioner

## 2021-08-09 ENCOUNTER — Ambulatory Visit (INDEPENDENT_AMBULATORY_CARE_PROVIDER_SITE_OTHER): Payer: 59

## 2021-08-09 VITALS — BP 129/79 | HR 67 | Resp 16 | Wt 159.8 lb

## 2021-08-09 DIAGNOSIS — E785 Hyperlipidemia, unspecified: Secondary | ICD-10-CM

## 2021-08-09 DIAGNOSIS — I83811 Varicose veins of right lower extremities with pain: Secondary | ICD-10-CM

## 2021-08-15 ENCOUNTER — Other Ambulatory Visit (INDEPENDENT_AMBULATORY_CARE_PROVIDER_SITE_OTHER): Payer: 59 | Admitting: Nurse Practitioner

## 2021-08-15 ENCOUNTER — Encounter (INDEPENDENT_AMBULATORY_CARE_PROVIDER_SITE_OTHER): Payer: Self-pay | Admitting: Nurse Practitioner

## 2021-08-15 DIAGNOSIS — E785 Hyperlipidemia, unspecified: Secondary | ICD-10-CM

## 2021-08-15 DIAGNOSIS — I83811 Varicose veins of right lower extremities with pain: Secondary | ICD-10-CM

## 2021-08-15 NOTE — Progress Notes (Signed)
Subjective:    Patient ID: Erica Grant, female    DOB: 26-Nov-1955, 66 y.o.   MRN: [id number] Chief Complaint  Patient presents with   Follow-up    Ultrasound follow up    Erica Grant is a 66 y.o. female.  The patient returns today for evaluation of painful varicosities in her right lower extremity.  The patient notes a previous history of endovenous ablation on the right leg approximately 9 years ago.  Initially this was very helpful for the patient but over the last year or 2 she has noticed that the varicosities have become painful again.  They are very tender with palpation as well as heaviness in her leg there is also pain and swelling in the right lower extremity.  She denies any issues with the left lower extremity which was also previously treated.  The patient works long hours standing and she uses medical grade compression socks daily and she has been wearing these for years.  Despite her diligent use of conservative therapy the patient still continues to have pain and issues that affect her everyday life.  She continues to elevate her legs as well and is very active and in good shape.  She denies any previous history of DVT or superficial thrombophlebitis.   Today the noninvasive studies noted no evidence of DVT or superficial thrombophlebitis in the right lower extremity.  No evidence of deep venous insufficiency.  The patient has extensive reflux in her great saphenous vein beginning at the saphenofemoral junction extending to the proximal calf.   Review of Systems  Cardiovascular:  Positive for leg swelling.      Objective:   Physical Exam Vitals reviewed.  HENT:     Head: Normocephalic.  Cardiovascular:     Rate and Rhythm: Normal rate.     Pulses: Normal pulses.  Pulmonary:     Effort: Pulmonary effort is normal.  Skin:    General: Skin is warm and dry.     Capillary Refill: Capillary refill takes less than 2 seconds.     Comments: Mild stasis dermatitis on  the right lower extremity in addition to large notable varicosity   Neurological:     Mental Status: She is alert and oriented to person, place, and time.  Psychiatric:        Mood and Affect: Mood normal.        Behavior: Behavior normal.        Thought Content: Thought content normal.        Judgment: Judgment normal.    BP 129/79 (BP Location: Right Arm)    Pulse 67    Resp 16    Wt 159 lb 12.8 oz (72.5 kg)    BMI 29.23 kg/m   Past Medical History:  Diagnosis Date   H/O urticaria    Hyperlipidemia    Hypothyroidism    Narrow angle glaucoma suspect    Osteopenia    Seasonal allergies    Varicose veins of lower extremity     Social History   Socioeconomic History   Marital status: Married    Spouse name: Not on file   Number of children: Not on file   Years of education: Not on file   Highest education level: Not on file  Occupational History   Not on file  Tobacco Use   Smoking status: Former    Packs/day: 1.00    Years: 10.00    Pack years: 10.00    Types: Cigarettes  Quit date: 06/13/1999    Years since quitting: 22.1   Smokeless tobacco: Never  Vaping Use   Vaping Use: Never used  Substance and Sexual Activity   Alcohol use: Yes    Comment: occ.   Drug use: Never   Sexual activity: Not on file  Other Topics Concern   Not on file  Social History Narrative   Not on file   Social Determinants of Health   Financial Resource Strain: Not on file  Food Insecurity: Not on file  Transportation Needs: Not on file  Physical Activity: Not on file  Stress: Not on file  Social Connections: Not on file  Intimate Partner Violence: Not on file    Past Surgical History:  Procedure Laterality Date   COLONOSCOPY WITH PROPOFOL N/A 05/30/2017   Procedure: COLONOSCOPY WITH PROPOFOL;  Surgeon: Manya Silvas, MD;  Location: [facility] ENDOSCOPY;  Service: Endoscopy;  Laterality: N/A;   COLONOSCOPY WITH PROPOFOL N/A 06/14/2020   Procedure: COLONOSCOPY WITH PROPOFOL;   Surgeon: Lesly Rubenstein, MD;  Location: [facility] ENDOSCOPY;  Service: Endoscopy;  Laterality: N/A;    Family History  Problem Relation Age of Onset   Varicose Veins Mother    Heart attack Mother    Cancer Brother    Breast cancer Neg Hx     Allergies  Allergen Reactions   Nickel Rash    No flowsheet data found.    CMP  No results found for: NA, K, CL, CO2, GLUCOSE, BUN, CREATININE, CALCIUM, PROT, ALBUMIN, AST, ALT, ALKPHOS, BILITOT, GFRNONAA, GFRAA   No results found.     Assessment & Plan:   1. Varicose veins of lower extremity with pain, right Recommend  I have reviewed my previous  discussion with the patient regarding  varicose veins and why they cause symptoms. Patient will continue  wearing graduated compression stockings class 1 on a daily basis, beginning first thing in the morning and removing them in the evening.    In addition, behavioral modification including elevation during the day was again discussed and this will continue.  The patient has utilized over the counter pain medications and has been exercising.  However, at this time conservative therapy has not alleviated the patient's symptoms of leg pain and swelling  Recommend: laser ablation of the right  great saphenous veins to eliminate the symptoms of pain and swelling of the lower extremities caused by the severe superficial venous reflux disease.    2. Hyperlipidemia, unspecified hyperlipidemia type Continue statin as ordered and reviewed, no changes at this time    Current Outpatient Medications on File Prior to Visit  Medication Sig Dispense Refill   Cholecalciferol 10000 units TABS Take by mouth daily.     Cinnamon Bark POWD 1 capsule by Does not apply route daily.     COVID-19 At Home Antigen Test [facility] COVID-19 HOME TEST) KIT use as directed 2 kit 0   econazole nitrate 1 % cream Apply twice daily to rash until improved, then as needed. 30 g 0   levothyroxine (SYNTHROID) 100 MCG  tablet TAKE 1 TABLET BY MOUTH EVERY MORNING (6:30AM) ON AN EMPTY STOMACH W/ GLASS OF WATER AT LEAST 30-60 MINUTES BEFORE BREAKFAST 90 tablet 3   levothyroxine (SYNTHROID) 100 MCG tablet Take 1 tablet (100 mcg total) by mouth once daily Take on an empty stomach with a glass of water at least 30-60 minutes before breakfast. 90 tablet 3   levothyroxine (SYNTHROID, LEVOTHROID) 100 MCG tablet Take 100 mcg by mouth  daily before breakfast.     mometasone (ELOCON) 0.1 % ointment Apply twice daily to affected areas until improved 45 g 1   OMEGA-3 FATTY ACIDS PO Take 1 tablet by mouth. (Patient not taking: Reported on 08/05/2021)     No current facility-administered medications on file prior to visit.    There are no Patient Instructions on file for this visit. No follow-ups on file.   Kris Hartmann, NP

## 2021-08-15 NOTE — Progress Notes (Signed)
? ?Subjective:  ? ? Patient ID: Erica Grant, female    DOB: 15-Sep-1955, 66 y.o.   MRN: [id number] ?No chief complaint on file. ? ? ?Erica Grant is a 66 y.o. female.  The patient returns today for evaluation of painful varicosities in her right lower extremity.  The patient notes a previous history of endovenous ablation on the right leg approximately 9 years ago.  Initially this was very helpful for the patient but over the last year or 2 she has noticed that the varicosities have become painful again.  They are very tender with palpation as well as heaviness in her leg there is also pain and swelling in the right lower extremity.  She denies any issues with the left lower extremity which was also previously treated.  The patient works long hours standing and she uses medical grade compression socks daily and she has been wearing these for years.  Despite her diligent use of conservative therapy the patient still continues to have pain and issues that affect her everyday life.  She continues to elevate her legs as well and is very active and in good shape.  She denies any previous history of DVT or superficial thrombophlebitis. ? ?Today the noninvasive studies noted no evidence of DVT or superficial thrombophlebitis in the right lower extremity.  No evidence of deep venous insufficiency.  The patient has extensive reflux in her great saphenous vein beginning at the saphenofemoral junction extending to the proximal calf. ? ? ?Review of Systems  ?Cardiovascular:  Positive for leg swelling.  ?All other systems reviewed and are negative. ? ?   ?Objective:  ? Physical Exam ?Vitals reviewed.  ?HENT:  ?   Head: Normocephalic.  ?Cardiovascular:  ?   Rate and Rhythm: Normal rate.  ?   Pulses: Normal pulses.  ?Pulmonary:  ?   Effort: Pulmonary effort is normal.  ?Skin: ?   General: Skin is warm and dry.  ?   Capillary Refill: Capillary refill takes less than 2 seconds.  ?   Comments: Mild stasis dermatitis on the  right lower extremity in addition to large notable varicosity  ?Neurological:  ?   Mental Status: She is alert and oriented to person, place, and time.  ?Psychiatric:     ?   Mood and Affect: Mood normal.     ?   Behavior: Behavior normal.     ?   Thought Content: Thought content normal.     ?   Judgment: Judgment normal.  ? ? ?There were no vitals taken for this visit. ? ?Past Medical History:  ?Diagnosis Date  ? H/O urticaria   ? Hyperlipidemia   ? Hypothyroidism   ? Narrow angle glaucoma suspect   ? Osteopenia   ? Seasonal allergies   ? Varicose veins of lower extremity   ? ? ?Social History  ? ?Socioeconomic History  ? Marital status: Married  ?  Spouse name: Not on file  ? Number of children: Not on file  ? Years of education: Not on file  ? Highest education level: Not on file  ?Occupational History  ? Not on file  ?Tobacco Use  ? Smoking status: Former  ?  Packs/day: 1.00  ?  Years: 10.00  ?  Pack years: 10.00  ?  Types: Cigarettes  ?  Quit date: 06/13/1999  ?  Years since quitting: 22.1  ? Smokeless tobacco: Never  ?Vaping Use  ? Vaping Use: Never used  ?Substance and Sexual  Activity  ? Alcohol use: Yes  ?  Comment: occ.  ? Drug use: Never  ? Sexual activity: Not on file  ?Other Topics Concern  ? Not on file  ?Social History Narrative  ? Not on file  ? ?Social Determinants of Health  ? ?Financial Resource Strain: Not on file  ?Food Insecurity: Not on file  ?Transportation Needs: Not on file  ?Physical Activity: Not on file  ?Stress: Not on file  ?Social Connections: Not on file  ?Intimate Partner Violence: Not on file  ? ? ?Past Surgical History:  ?Procedure Laterality Date  ? COLONOSCOPY WITH PROPOFOL N/A 05/30/2017  ? Procedure: COLONOSCOPY WITH PROPOFOL;  Surgeon: Manya Silvas, MD;  Location: [facility] ENDOSCOPY;  Service: Endoscopy;  Laterality: N/A;  ? COLONOSCOPY WITH PROPOFOL N/A 06/14/2020  ? Procedure: COLONOSCOPY WITH PROPOFOL;  Surgeon: Lesly Rubenstein, MD;  Location: [facility] ENDOSCOPY;  Service:  Endoscopy;  Laterality: N/A;  ? ? ?Family History  ?Problem Relation Age of Onset  ? Varicose Veins Mother   ? Heart attack Mother   ? Cancer Brother   ? Breast cancer Neg Hx   ? ? ?Allergies  ?Allergen Reactions  ? Nickel Rash  ? ? ?No flowsheet data found. ? ? ? ?CMP  ?No results found for: NA, K, CL, CO2, GLUCOSE, BUN, CREATININE, CALCIUM, PROT, ALBUMIN, AST, ALT, ALKPHOS, BILITOT, GFRNONAA, GFRAA ? ? ?No results found. ? ?   ?Assessment & Plan:  ? ?1. Varicose veins of lower extremity with pain, right ?Recommend ? ?I have reviewed my previous  discussion with the patient regarding  varicose veins and why they cause symptoms. Patient will continue  wearing graduated compression stockings class 1 on a daily basis, beginning first thing in the morning and removing them in the evening.   ? ?In addition, behavioral modification including elevation during the day was again discussed and this will continue. ? ?The patient has utilized over the counter pain medications and has been exercising. ? ?However, at this time conservative therapy has not alleviated the patient's symptoms of leg pain and swelling ? ?Recommend: laser ablation of the right great saphenous veins to eliminate the symptoms of pain and swelling of the lower extremities caused by the severe superficial venous reflux disease.   ? ?2. Hyperlipidemia, unspecified hyperlipidemia type ?Patient will continue to take fish oil currently patient not medicated we will continue to control with diet. ? ? ?Current Outpatient Medications on File Prior to Visit  ?Medication Sig Dispense Refill  ? Cholecalciferol 10000 units TABS Take by mouth daily.    ? Cinnamon Bark POWD 1 capsule by Does not apply route daily.    ? COVID-19 At Home Antigen Test [facility] COVID-19 HOME TEST) KIT use as directed 2 kit 0  ? econazole nitrate 1 % cream Apply twice daily to rash until improved, then as needed. 30 g 0  ? levothyroxine (SYNTHROID) 100 MCG tablet TAKE 1 TABLET BY MOUTH  EVERY MORNING (6:30AM) ON AN EMPTY STOMACH W/ GLASS OF WATER AT LEAST 30-60 MINUTES BEFORE BREAKFAST 90 tablet 3  ? levothyroxine (SYNTHROID) 100 MCG tablet Take 1 tablet (100 mcg total) by mouth once daily Take on an empty stomach with a glass of water at least 30-60 minutes before breakfast. 90 tablet 3  ? levothyroxine (SYNTHROID, LEVOTHROID) 100 MCG tablet Take 100 mcg by mouth daily before breakfast.    ? mometasone (ELOCON) 0.1 % ointment Apply twice daily to affected areas until improved 45 g 1  ?  OMEGA-3 FATTY ACIDS PO Take 1 tablet by mouth. (Patient not taking: Reported on 08/05/2021)    ? ?No current facility-administered medications on file prior to visit.  ? ? ?There are no Patient Instructions on file for this visit. ?No follow-ups on file. ? ? ?Kris Hartmann, NP ? ? ?

## 2021-08-24 ENCOUNTER — Other Ambulatory Visit: Payer: Self-pay

## 2021-08-30 ENCOUNTER — Telehealth (INDEPENDENT_AMBULATORY_CARE_PROVIDER_SITE_OTHER): Payer: Self-pay | Admitting: Vascular Surgery

## 2021-08-30 NOTE — Telephone Encounter (Signed)
Spoke with pt - attempted to make laser appt. Pt stated that she was in [place] and didn't have her schedule. She will call us [place].  ?

## 2021-10-04 DIAGNOSIS — M79645 Pain in left finger(s): Secondary | ICD-10-CM | POA: Diagnosis not present

## 2021-10-06 DIAGNOSIS — S60453A Superficial foreign body of left middle finger, initial encounter: Secondary | ICD-10-CM | POA: Diagnosis not present

## 2021-10-06 DIAGNOSIS — W25XXXA Contact with sharp glass, initial encounter: Secondary | ICD-10-CM | POA: Diagnosis not present

## 2021-10-07 ENCOUNTER — Other Ambulatory Visit: Payer: Self-pay | Admitting: Surgery

## 2021-10-10 ENCOUNTER — Encounter
Admission: RE | Admit: 2021-10-10 | Discharge: 2021-10-10 | Disposition: A | Discharge status: Home / Self Care | Payer: 59 | Source: Ambulatory Visit | Attending: Surgery | Admitting: Surgery

## 2021-10-10 HISTORY — DX: Benign neoplasm, unspecified site: D36.9

## 2021-10-10 NOTE — Patient Instructions (Addendum)
Your procedure is scheduled on: Wednesday, May 3 ?Report to the Registration Desk on the 1st floor of the [facility]. ?To find out your arrival time, please call [phone] between 1PM - 3PM on: Tuesday, May 2 ?If your arrival time is 6:00 am, do not arrive prior to that time as the [facility] entrance doors do not open until 6:00 am. ? ?REMEMBER: ?Instructions that are not followed completely may result in serious medical risk, up to and including death; or upon the discretion of your surgeon and anesthesiologist your surgery may need to be rescheduled. ? ?Do not eat food after midnight the night before surgery.  ?No gum chewing, lozengers or hard candies. ? ?You may however, drink CLEAR liquids up to 2 hours before you are scheduled to arrive for your surgery. Do not drink anything within 2 hours of your scheduled arrival time. ? ?Clear liquids include: ?- water  ?- apple juice without pulp ?- gatorade (not RED colors) ?- black coffee or tea (Do NOT add milk or creamers to the coffee or tea) ?Do NOT drink anything that is not on this list. ? ?In addition, your doctor has ordered for you to drink the provided  ?Ensure Pre-Surgery Clear Carbohydrate Drink  ?Drinking this carbohydrate drink up to two hours before surgery helps to reduce insulin resistance and improve patient outcomes. Please complete drinking 2 hours prior to scheduled arrival time. ? ?TAKE THESE MEDICATIONS THE MORNING OF SURGERY WITH A SIP OF WATER: ? ?Levothyroxine ? ?One week prior to surgery: ?Stop Anti-inflammatories (NSAIDS) such as Advil, Aleve, Ibuprofen, Motrin, Naproxen, Naprosyn and Aspirin based products such as Excedrin, Goodys Powder, BC Powder. ?Stop ANY OVER THE COUNTER supplements until after surgery. ?You may however, continue to take Tylenol if needed for pain up until the day of surgery. ? ?No Alcohol for 24 hours before or after surgery. ? ?No Smoking including e-cigarettes for 24 hours prior to surgery.  ?No chewable  tobacco products for at least 6 hours prior to surgery.  ?No nicotine patches on the day of surgery. ? ?Do not use any "recreational" drugs for at least a week prior to your surgery.  ?Please be advised that the combination of cocaine and anesthesia may have negative outcomes, up to and including death. ?If you test positive for cocaine, your surgery will be cancelled. ? ?On the morning of surgery brush your teeth with toothpaste and water, you may rinse your mouth with mouthwash if you wish. ?Do not swallow any toothpaste or mouthwash. ? ?Use CHG Soap as directed on instruction sheet. ? ?Do not wear jewelry, make-up, hairpins, clips or nail polish. ? ?Do not wear lotions, powders, or perfumes.  ? ?Do not shave body from the neck down 48 hours prior to surgery just in case you cut yourself which could leave a site for infection.  ?Also, freshly shaved skin may become irritated if using the CHG soap. ? ?Contact lenses, hearing aids and dentures may not be worn into surgery. ? ?Do not bring valuables to the hospital. [facility] is not responsible for any missing/lost belongings or valuables.  ? ?Notify your doctor if there is any change in your medical condition (cold, fever, infection). ? ?Wear comfortable clothing (specific to your surgery type) to the hospital. ? ?After surgery, you can help prevent lung complications by doing breathing exercises.  ?Take deep breaths and cough every 1-2 hours. Your doctor may order a device called an Incentive Spirometer to help you take deep  breaths. ? ?If you are being discharged the day of surgery, you will not be allowed to drive home. ?You will need a responsible adult (18 years or older) to drive you home and stay with you that night.  ? ?If you are taking public transportation, you will need to have a responsible adult (18 years or older) with you. ?Please confirm with your physician that it is acceptable to use public transportation.  ? ?Please call the [facility] at [phone] if you have any questions about these instructions. ? ?Surgery Visitation Policy: ? ?Patients undergoing a surgery or procedure may have two family members or support persons with them as long as the person is not COVID-19 positive or experiencing its symptoms.  ?

## 2021-10-12 ENCOUNTER — Ambulatory Visit: Payer: 59 | Admitting: Anesthesiology

## 2021-10-12 ENCOUNTER — Encounter: Payer: Self-pay | Admitting: Surgery

## 2021-10-12 ENCOUNTER — Other Ambulatory Visit: Payer: Self-pay

## 2021-10-12 ENCOUNTER — Ambulatory Visit
Admission: RE | Admit: 2021-10-12 | Discharge: 2021-10-12 | Disposition: A | Discharge status: Home / Self Care | Payer: 59 | Source: Ambulatory Visit | Attending: Surgery | Admitting: Surgery

## 2021-10-12 ENCOUNTER — Encounter
Admission: RE | Disposition: A | Discharge status: Home / Self Care | Payer: Self-pay | Source: Ambulatory Visit | Attending: Surgery

## 2021-10-12 DIAGNOSIS — S60453A Superficial foreign body of left middle finger, initial encounter: Secondary | ICD-10-CM | POA: Diagnosis not present

## 2021-10-12 DIAGNOSIS — Z87891 Personal history of nicotine dependence: Secondary | ICD-10-CM | POA: Diagnosis not present

## 2021-10-12 DIAGNOSIS — M795 Residual foreign body in soft tissue: Secondary | ICD-10-CM | POA: Insufficient documentation

## 2021-10-12 DIAGNOSIS — S60459A Superficial foreign body of unspecified finger, initial encounter: Secondary | ICD-10-CM | POA: Diagnosis not present

## 2021-10-12 DIAGNOSIS — Z91048 Other nonmedicinal substance allergy status: Secondary | ICD-10-CM | POA: Diagnosis not present

## 2021-10-12 DIAGNOSIS — W25XXXA Contact with sharp glass, initial encounter: Secondary | ICD-10-CM | POA: Diagnosis not present

## 2021-10-12 DIAGNOSIS — E039 Hypothyroidism, unspecified: Secondary | ICD-10-CM | POA: Diagnosis not present

## 2021-10-12 HISTORY — PX: INCISION AND DRAINAGE WOUND WITH FOREIGN BODY REMOVAL: SHX5635

## 2021-10-12 SURGERY — INCISION AND DRAINAGE WOUND WITH FOREIGN BODY REMOVAL
Anesthesia: General | Site: Finger | Laterality: Left

## 2021-10-12 MED ORDER — MIDAZOLAM HCL 2 MG/2ML IJ SOLN
INTRAMUSCULAR | Status: AC
  Filled 2021-10-12: qty 2

## 2021-10-12 MED ORDER — KETOROLAC TROMETHAMINE 15 MG/ML IJ SOLN
15.0000 mg | Freq: Once | INTRAMUSCULAR | Status: AC
Start: 2021-10-12 — End: 2021-10-12
  Administered 2021-10-12: 15 mg via INTRAVENOUS

## 2021-10-12 MED ORDER — BUPIVACAINE HCL (PF) 0.5 % IJ SOLN
INTRAMUSCULAR | Status: AC
  Filled 2021-10-12: qty 30

## 2021-10-12 MED ORDER — SODIUM CHLORIDE 0.9 % IV SOLN: INTRAVENOUS | Status: DC

## 2021-10-12 MED ORDER — MOMETASONE FUROATE 0.1 % EX OINT: 1.0000 "application " | TOPICAL_OINTMENT | Freq: Every day | CUTANEOUS | Status: AC | PRN

## 2021-10-12 MED ORDER — ACETAMINOPHEN 325 MG PO TABS: 325.0000 mg | ORAL_TABLET | Freq: Four times a day (QID) | ORAL | Status: DC | PRN

## 2021-10-12 MED ORDER — FAMOTIDINE 20 MG PO TABS: 20.0000 mg | ORAL_TABLET | Freq: Once | ORAL | Status: AC

## 2021-10-12 MED ORDER — ONDANSETRON HCL 4 MG/2ML IJ SOLN: 4.0000 mg | Freq: Once | INTRAMUSCULAR | Status: DC | PRN

## 2021-10-12 MED ORDER — METOCLOPRAMIDE HCL 10 MG PO TABS: 5.0000 mg | ORAL_TABLET | Freq: Three times a day (TID) | ORAL | Status: DC | PRN

## 2021-10-12 MED ORDER — CHLORHEXIDINE GLUCONATE 0.12 % MT SOLN: 15.0000 mL | Freq: Once | OROMUCOSAL | Status: AC

## 2021-10-12 MED ORDER — ONDANSETRON HCL 4 MG/2ML IJ SOLN: 4.0000 mg | Freq: Four times a day (QID) | INTRAMUSCULAR | Status: DC | PRN

## 2021-10-12 MED ORDER — FENTANYL CITRATE (PF) 100 MCG/2ML IJ SOLN: 25.0000 ug | INTRAMUSCULAR | Status: DC | PRN

## 2021-10-12 MED ORDER — FENTANYL CITRATE (PF) 100 MCG/2ML IJ SOLN
INTRAMUSCULAR | Status: AC
  Filled 2021-10-12: qty 2

## 2021-10-12 MED ORDER — PROPOFOL 500 MG/50ML IV EMUL
INTRAVENOUS | Status: DC | PRN
  Administered 2021-10-12: 165 ug/kg/min via INTRAVENOUS

## 2021-10-12 MED ORDER — PROPOFOL 10 MG/ML IV BOLUS
INTRAVENOUS | Status: AC
  Filled 2021-10-12: qty 20

## 2021-10-12 MED ORDER — LACTATED RINGERS IV SOLN: INTRAVENOUS | Status: DC

## 2021-10-12 MED ORDER — ACETAMINOPHEN 10 MG/ML IV SOLN: 1000.0000 mg | Freq: Once | INTRAVENOUS | Status: DC | PRN

## 2021-10-12 MED ORDER — ONDANSETRON HCL 4 MG/2ML IJ SOLN
INTRAMUSCULAR | Status: AC
  Filled 2021-10-12: qty 2

## 2021-10-12 MED ORDER — LIDOCAINE HCL (PF) 2 % IJ SOLN
INTRAMUSCULAR | Status: AC
  Filled 2021-10-12: qty 5

## 2021-10-12 MED ORDER — DEXAMETHASONE SODIUM PHOSPHATE 10 MG/ML IJ SOLN
INTRAMUSCULAR | Status: DC | PRN
Start: 2021-10-12 — End: 2021-10-12
  Administered 2021-10-12: 5 mg via INTRAVENOUS

## 2021-10-12 MED ORDER — FAMOTIDINE 20 MG PO TABS
ORAL_TABLET | ORAL | Status: AC
  Administered 2021-10-12: 20 mg via ORAL
  Filled 2021-10-12: qty 1

## 2021-10-12 MED ORDER — FENTANYL CITRATE (PF) 100 MCG/2ML IJ SOLN
INTRAMUSCULAR | Status: DC | PRN
  Administered 2021-10-12 (×2): 25 ug via INTRAVENOUS

## 2021-10-12 MED ORDER — ONDANSETRON HCL 4 MG PO TABS: 4.0000 mg | ORAL_TABLET | Freq: Four times a day (QID) | ORAL | Status: DC | PRN

## 2021-10-12 MED ORDER — METOCLOPRAMIDE HCL 5 MG/ML IJ SOLN: 5.0000 mg | Freq: Three times a day (TID) | INTRAMUSCULAR | Status: DC | PRN

## 2021-10-12 MED ORDER — CHLORHEXIDINE GLUCONATE 0.12 % MT SOLN
OROMUCOSAL | Status: AC
  Administered 2021-10-12: 15 mL via OROMUCOSAL
  Filled 2021-10-12: qty 15

## 2021-10-12 MED ORDER — KETOROLAC TROMETHAMINE 15 MG/ML IJ SOLN
INTRAMUSCULAR | Status: AC
  Filled 2021-10-12: qty 1

## 2021-10-12 MED ORDER — MIDAZOLAM HCL 2 MG/2ML IJ SOLN
INTRAMUSCULAR | Status: DC | PRN
  Administered 2021-10-12 (×2): 1 mg via INTRAVENOUS

## 2021-10-12 MED ORDER — PHENYLEPHRINE 80 MCG/ML (10ML) SYRINGE FOR IV PUSH (FOR BLOOD PRESSURE SUPPORT)
PREFILLED_SYRINGE | INTRAVENOUS | Status: DC | PRN
Start: 2021-10-12 — End: 2021-10-12
  Administered 2021-10-12: 80 ug via INTRAVENOUS

## 2021-10-12 MED ORDER — PROPOFOL 10 MG/ML IV BOLUS
INTRAVENOUS | Status: DC | PRN
  Administered 2021-10-12: 20 mg via INTRAVENOUS

## 2021-10-12 MED ORDER — BUPIVACAINE HCL 0.5 % IJ SOLN
INTRAMUSCULAR | Status: DC | PRN
  Administered 2021-10-12: 5 mL

## 2021-10-12 MED ORDER — CEFAZOLIN SODIUM-DEXTROSE 2-4 GM/100ML-% IV SOLN
INTRAVENOUS | Status: AC
  Filled 2021-10-12: qty 100

## 2021-10-12 MED ORDER — OXYCODONE HCL 5 MG/5ML PO SOLN: 5.0000 mg | Freq: Once | ORAL | Status: DC | PRN

## 2021-10-12 MED ORDER — OXYCODONE HCL 5 MG PO TABS: 5.0000 mg | ORAL_TABLET | Freq: Once | ORAL | Status: DC | PRN

## 2021-10-12 MED ORDER — CEFAZOLIN SODIUM-DEXTROSE 2-4 GM/100ML-% IV SOLN
2.0000 g | INTRAVENOUS | Status: AC
  Administered 2021-10-12: 2 g via INTRAVENOUS

## 2021-10-12 MED ORDER — ORAL CARE MOUTH RINSE: 15.0000 mL | Freq: Once | OROMUCOSAL | Status: AC

## 2021-10-12 SURGICAL SUPPLY — 31 items
APL PRP STRL LF DISP 70% ISPRP (MISCELLANEOUS) ×1
BNDG COHESIVE 4X5 TAN ST LF (GAUZE/BANDAGES/DRESSINGS) ×1 IMPLANT
BNDG ESMARK 4X12 TAN STRL LF (GAUZE/BANDAGES/DRESSINGS) ×2 IMPLANT
CHLORAPREP W/TINT 26 (MISCELLANEOUS) ×2 IMPLANT
CORD BIP STRL DISP 12FT (MISCELLANEOUS) ×2 IMPLANT
CUFF TOURN SGL QUICK 18X4 (TOURNIQUET CUFF) IMPLANT
CUFF TOURN SGL QUICK 24 (TOURNIQUET CUFF)
CUFF TRNQT CYL 24X4X16.5-23 (TOURNIQUET CUFF) IMPLANT
FORCEPS JEWEL BIP 4-3/4 STR (INSTRUMENTS) ×2 IMPLANT
GAUZE SPONGE 4X4 12PLY STRL (GAUZE/BANDAGES/DRESSINGS) ×2 IMPLANT
GAUZE XEROFORM 1X8 LF (GAUZE/BANDAGES/DRESSINGS) ×2 IMPLANT
GLOVE SURG ENC MOIS LTX SZ8 (GLOVE) ×4 IMPLANT
GLOVE SURG PR MICRO ENCORE 7.5 (GLOVE) ×1 IMPLANT
GLOVE SURG UNDER LTX SZ8 (GLOVE) ×2 IMPLANT
GOWN STRL REUS W/ TWL LRG LVL3 (GOWN DISPOSABLE) ×1 IMPLANT
GOWN STRL REUS W/ TWL XL LVL3 (GOWN DISPOSABLE) ×1 IMPLANT
GOWN STRL REUS W/TWL LRG LVL3 (GOWN DISPOSABLE) ×2
GOWN STRL REUS W/TWL XL LVL3 (GOWN DISPOSABLE) ×2
KIT TURNOVER KIT A (KITS) ×2 IMPLANT
MANIFOLD NEPTUNE II (INSTRUMENTS) ×2 IMPLANT
NS IRRIG 500ML POUR BTL (IV SOLUTION) ×2 IMPLANT
PACK EXTREMITY ARMC (MISCELLANEOUS) ×2 IMPLANT
PAD CAST CTTN 4X4 STRL (SOFTGOODS) ×1 IMPLANT
PADDING CAST COTTON 4X4 STRL (SOFTGOODS)
SPONGE VERSALON 4X4 4PLY (MISCELLANEOUS) ×1 IMPLANT
STOCKINETTE 48X4 2 PLY STRL (GAUZE/BANDAGES/DRESSINGS) ×1 IMPLANT
STOCKINETTE IMPERVIOUS 9X36 MD (GAUZE/BANDAGES/DRESSINGS) ×1 IMPLANT
STOCKINETTE STRL 4IN 9604848 (GAUZE/BANDAGES/DRESSINGS) ×2 IMPLANT
STRAP SAFETY 5IN WIDE (MISCELLANEOUS) ×2 IMPLANT
SUT PROLENE 4 0 PS 2 18 (SUTURE) ×2 IMPLANT
WATER STERILE IRR 500ML POUR (IV SOLUTION) ×2 IMPLANT

## 2021-10-12 NOTE — Transfer of Care (Signed)
Immediate Anesthesia Transfer of Care Note ? ?Patient: Erica Grant ? ?Procedure(s) Performed: LEFT MIDDLE FINGER FOREIGN BODY REMOVAL (Left: Finger) ? ?Patient Location: PACU ? ?Anesthesia Type:MAC ? ?Level of Consciousness: drowsy ? ?Airway & Oxygen Therapy: Patient Spontanous Breathing and Patient connected to face mask oxygen ? ?Post-op Assessment: Report given to RN and Post -op Vital signs reviewed and stable ? ?Post vital signs: Reviewed and stable ? ?Last Vitals:  ?Vitals Value Taken Time  ?BP 107/73 10/12/21 1117  ?Temp 36.6 ?C 10/12/21 1117  ?Pulse 64 10/12/21 1120  ?Resp 20 10/12/21 1120  ?SpO2 98 % 10/12/21 1120  ?Vitals shown include unvalidated device data. ? ?Last Pain:  ?Vitals:  ? 10/12/21 1117  ?TempSrc:   ?PainSc: Asleep  ?   ? ?  ? ?Complications: No notable events documented. ?

## 2021-10-12 NOTE — H&P (Signed)
History of Present Illness: ?Erica Grant is a 66 y.o. female who presents today for evaluation of a left middle finger injury. The patient states that 4 weeks ago she was opening a container, she believes it was a type of vitamin drops, as she was twisting open she felt something extremely short with her finger and she noticed a small cut. After this occurred she noticed that the glass was broken however she did not seek any medical attention. The patient states that it did bleed significantly however she was able to get the bleeding under control. Since then she reports increased pain and swelling, the volar, ulnar aspect of the distal middle finger. She denies any fevers or chills. She denies any numbness or tingling to the left upper extremity. She denies any drainage from the area. She does report moderate pain with palpation over the area and if she accidentally puts pressure up against her finger. The patient is right-hand dominant. No surgical history to the left hand. The patient does have a nickel allergy. The patient denies any personal history of heart attack, stroke, asthma or COPD. No personal history of blood clots. ? ?Past Medical History: ? Allergic state (Usually spring and fall)  ? History of urticaria  ? Hyperlipidemia  ? Hypothyroidism  ? Narrow angle glaucoma suspect  ? Osteopenia  ? Varicose veins s/p injections  ? ?Past Surgical History: ? COLONOSCOPY 04/11/2012 (Adenomatous Polyp: CBF 03/2017)  ? COLONOSCOPY 05/30/2017 (Adenomatous Polyps: CBF 05/2020)  ? COLONOSCOPY 06/14/2020 (Normal colon/PHx CP/Repeat 1yr/CTL)  ? ?Past Family History: ? Diabetes Mother  ? Skin cancer Father  ? Breast cancer Neg Hx  ? Colon cancer Neg Hx  ? Ovarian cancer Neg Hx  ? ?Medications: ? calcium carbonate 500 mg calcium (1,250 mg) chewable tablet Take 500 mg of elemental by mouth 2 (two) times daily with meals  ? cholecalciferol (VITAMIN D3) 1000 unit tablet Take by mouth  ? levothyroxine (SYNTHROID) 100  MCG tablet Take 1 tablet (100 mcg total) by mouth once daily Take on an empty stomach with a glass of water at least 30-60 minutes before breakfast. 90 tablet 3  ? ?Allergies:  ? Nickel Rash  ? ?Review of Systems:  ?A comprehensive 14 point ROS was performed, reviewed by me today, and the pertinent orthopaedic findings are documented in the HPI. ? ?Physical Exam: ?BP (!) 142/84  Ht 157.5 cm ('5\' 2"'$ )  Wt 72.8 kg (160 lb 9.6 oz)  BMI 29.37 kg/m?  ?General/Constitutional: The patient appears to be well-nourished, well-developed, and in no acute distress. ?Neuro/Psych: Normal mood and affect, oriented to person, place and time. ?Eyes: Non-icteric. Pupils are equal, round, and reactive to light, and exhibit synchronous movement. ?ENT: Unremarkable. ?Lymphatic: No palpable adenopathy. ?Respiratory: Lungs clear to auscultation, Normal chest excursion, No wheezes and Non-labored breathing ?Cardiovascular: Regular rate and rhythm. No murmurs. and No edema, swelling or tenderness, except as noted in detailed exam. ?Integumentary: No impressive skin lesions present, except as noted in detailed exam. ?Musculoskeletal: Unremarkable, except as noted in detailed exam. ? ?Skin examination of the left hand demonstrates swelling noted along the volar aspect of the fingertip involving the left middle finger. There is no erythema or ecchymosis or open wound at this time. No purulent drainage. The patient is tender to palpation along the volar, ulnar aspect along the distal phalanx. No damage to the nail is identified. The patient is able to fully flex and extend the middle finger. Full motion and full strength with  FDS and FDP testing. No angulation or rotation of the digit is identified. The patient does have intact cap refill. Intact sensation light touch left upper extremity. Radial pulse intact to the left wrist. ? ?Imaging: ?AP, lateral and oblique images of the left middle finger were obtained today in the office and reviewed  by me. These x-rays demonstrate evidence of a shard of glass which measures 8.3 mm along the volar, ulnar aspect of the distal phalanx. There does not appear to be any acute fracture no evidence of dislocation. No significant degenerative changes are identified. On AP view the shard of glass can be visualized along the ulnar aspect next to the distal phalanx. ? ?Impression: ?1. Foreign body in skin of finger. ?2. Injury from broken glass. ? ?Plan:  ?1. Treatment options were discussed today with the patient. ?2. X-rays of the left middle finger demonstrate evidence of a shard of glass present just to the ulnar aspect of the distal phalanx deep to the skin. ?3. The shard of glass cannot be palpated here at today's visit. Discussed the risk and benefits of performing a in office procedure to try to remove the shard of glass versus performing in a controlled environment in the operating room. ?4. The patient will be scheduled for a foreign body removal from the left middle finger with Dr. Roland Rack on either Tuesday or Wednesday of next week. Risk and benefits surgery were discussed in detail at today's visit. ?5. This document will serve as a surgical history and physical for the patient. ?6. The patient will follow-up per standard postop protocol. They can call the clinic they have any questions, new symptoms develop or symptoms worsen. ? ?The procedure was discussed with the patient, as were the potential risks (including bleeding, infection, nerve and/or blood vessel injury, persistent or recurrent pain, failure to remove all over the glass, need for further surgery, blood clots, strokes, heart attacks and/or arhythmias, pneumonia, etc.) and benefits. The patient states her understanding and wishes to proceed. ? ? ?H&P reviewed and patient re-examined. No changes. ? ?

## 2021-10-12 NOTE — Anesthesia Preprocedure Evaluation (Addendum)
Anesthesia Evaluation  ?Patient identified by MRN, date of birth, ID band ?Patient awake ? ? ? ?Reviewed: ?Allergy & Precautions, NPO status , Patient's Chart, lab work & pertinent test results ? ?History of Anesthesia Complications ?Negative for: history of anesthetic complications ? ?Airway ?Mallampati: II ? ? ?Neck ROM: Full ? ? ? Dental ? ?(+) Missing ?  ?Pulmonary ?former smoker (quit greater than 20 years ago),  ?  ?Pulmonary exam normal ?breath sounds clear to auscultation ? ? ? ? ? ? Cardiovascular ?Exercise Tolerance: Good ?negative cardio ROS ?Normal cardiovascular exam ?Rhythm:Regular Rate:Normal ? ? ?  ?Neuro/Psych ?negative neurological ROS ?   ? GI/Hepatic ?negative GI ROS,   ?Endo/Other  ?Hypothyroidism  ? Renal/GU ?negative Renal ROS  ? ?  ?Musculoskeletal ? ? Abdominal ?  ?Peds ? Hematology ?negative hematology ROS ?(+)   ?Anesthesia Other Findings ? ? Reproductive/Obstetrics ? ?  ? ? ? ? ? ? ? ? ? ? ? ? ? ?  ?  ? ? ? ? ? ? ? ?Anesthesia Physical ?Anesthesia Plan ? ?ASA: 2 ? ?Anesthesia Plan: General  ? ?Post-op Pain Management:   ? ?Induction: Intravenous ? ?PONV Risk Score and Plan: 3 and Propofol infusion, TIVA, Treatment may vary due to age or medical condition and Ondansetron ? ?Airway Management Planned: Natural Airway ? ?Additional Equipment:  ? ?Intra-op Plan:  ? ?Post-operative Plan:  ? ?Informed Consent: I have reviewed the patients History and Physical, chart, labs and discussed the procedure including the risks, benefits and alternatives for the proposed anesthesia with the patient or authorized representative who has indicated his/her understanding and acceptance.  ? ? ? ? ? ?Plan Discussed with: CRNA ? ?Anesthesia Plan Comments: (LMA/GETA backup discussed.  Patient consented for risks of anesthesia including but not limited to:  ?- adverse reactions to medications ?- damage to eyes, teeth, lips or other oral mucosa ?- nerve damage due to positioning   ?- sore throat or hoarseness ?- damage to heart, brain, nerves, lungs, other parts of body or loss of life ? ?Informed patient about role of CRNA in peri- and intra-operative care.  Patient voiced understanding.)  ? ? ? ? ? ? ?Anesthesia Quick Evaluation ? ?

## 2021-10-12 NOTE — Discharge Instructions (Addendum)
Orthopedic discharge instructions: ?Keep dressing dry and intact. ?Keep hand elevated above heart level. ?May shower after dressing removed on postop day 4 (Sunday). ?Cover sutures with Band-Aids after drying off. ?Apply ice to affected area frequently. ?Take ibuprofen 600-800 mg TID with meals for 5-7 days, then as necessary. ?Take ES Tylenol when needed.  ?Return for follow-up in 10-14 days or as scheduled. ? ? ?AMBULATORY SURGERY  ?DISCHARGE INSTRUCTIONS ? ? ?The drugs that you were given will stay in your system until tomorrow so for the next 24 hours you should not: ? ?Drive an automobile ?Make any legal decisions ?Drink any alcoholic beverage ? ? ?You may resume regular meals tomorrow.  Today it is better to start with liquids and gradually work up to solid foods. ? ?You may eat anything you prefer, but it is better to start with liquids, then soup and crackers, and gradually work up to solid foods. ? ? ?Please notify your doctor immediately if you have any unusual bleeding, trouble breathing, redness and pain at the surgery site, drainage, fever, or pain not relieved by medication. ?  ? ? ?Additional Instructions:  ?

## 2021-10-12 NOTE — Anesthesia Postprocedure Evaluation (Signed)
Anesthesia Post Note ? ?Patient: VANETA HAMMONTREE ? ?Procedure(s) Performed: LEFT MIDDLE FINGER FOREIGN BODY REMOVAL (Left: Finger) ? ?Patient location during evaluation: PACU ?Anesthesia Type: General ?Level of consciousness: awake and alert, oriented and patient cooperative ?Pain management: pain level controlled ?Vital Signs Assessment: post-procedure vital signs reviewed and stable ?Respiratory status: spontaneous breathing, nonlabored ventilation and respiratory function stable ?Cardiovascular status: blood pressure returned to baseline and stable ?Postop Assessment: adequate PO intake ?Anesthetic complications: no ? ? ?No notable events documented. ? ? ?Last Vitals:  ?Vitals:  ? 10/12/21 1145 10/12/21 1201  ?BP: 124/83 132/90  ?Pulse: 65 70  ?Resp: 18 16  ?Temp: (!) 36.4 ?C (!) 36.2 ?C  ?SpO2: 97% 99%  ?  ?Last Pain:  ?Vitals:  ? 10/12/21 1201  ?TempSrc: Temporal  ?PainSc: 0-No pain  ? ? ?  ?  ?  ?  ?  ?  ? ?Darrin Nipper ? ? ? ? ?

## 2021-10-12 NOTE — Op Note (Signed)
10/12/2021 ? ?11:16 AM ? ?Patient:   Erica Grant ? ?Pre-Op Diagnosis:   Retained foreign body, left long fingertip. ? ?Post-Op Diagnosis:   Same ? ?Procedure:   Exploration and removal of foreign body left long fingertip. ? ?Surgeon:   Pascal Lux, MD ? ?Assistant:   None ? ?Anesthesia:   Local with IV sedation ? ?Findings:   As above. ? ?Complications:   None ? ?Fluids:   300 cc crystalloid ? ?EBL:   1 cc ? ?UOP:   None ? ?TT:   Digital tourniquet for approximately 15 minutes ? ?Drains:   None ? ?Closure:   4-0 Prolene interrupted sutures ? ?Brief Clinical Note:   The patient is a 66 year old female who sustained an injury to her left long finger while opening a glass bottle of vitamin drops she felt this sharp stabbing her finger and noticed that the glass bottle was broken.  However, she did not seek any treatment at the time.  Because of continued tenderness to palpation and or with pressure over the volar pulp of her left long fingertip, she presented to the orthopedic clinic last week where x-rays demonstrated the presence of retained piece of glass in the fingertip.  She presents at this time for removal of the piece of glass. ? ?Procedure:   The patient was brought into the operating room and lain in the supine position.  After adequate IV sedation was achieved, the left hand and upper extremity were prepped with ChloraPrep solution before being draped sterilely.  Preoperative antibiotics were administered.  A timeout was performed to verify the appropriate surgical site before a digital block was performed using 5 cc of 0.5% plain Sensorcaine. A finger tourniquet was applied over the long finger. ? ?An approximate 1.5 cm incision was made along the ulnar aspect of the distal phalanx of the long finger.  This incision was carried down into the subcutaneous tissues.  This area was carefully inspected and palpated until the tip of the piece of glass was identified.  This was removed in its entirety.   The wound was copiously irrigated with sterile saline solution before being closed using 4-0 Prolene interrupted sutures.  A sterile bulky dressing was applied to the finger before the patient was awakened and returned to the recovery room in satisfactory condition after tolerating the procedure well. ?

## 2021-10-13 ENCOUNTER — Encounter: Payer: Self-pay | Admitting: Surgery

## 2021-10-13 LAB — SURGICAL PATHOLOGY

## 2021-10-27 ENCOUNTER — Other Ambulatory Visit: Payer: Self-pay

## 2021-10-27 MED ORDER — ALPRAZOLAM 0.5 MG PO TABS
ORAL_TABLET | ORAL | 0 refills | Status: AC
  Filled 2021-10-27: qty 2, 1d supply, fill #0

## 2021-10-31 ENCOUNTER — Other Ambulatory Visit: Payer: Self-pay

## 2021-10-31 MED ORDER — MOMETASONE FUROATE 0.1 % EX OINT
TOPICAL_OINTMENT | CUTANEOUS | 0 refills | Status: AC
  Filled 2021-10-31: qty 45, 20d supply, fill #0

## 2021-11-01 ENCOUNTER — Other Ambulatory Visit: Payer: Self-pay

## 2021-11-14 IMAGING — MG MM DIGITAL SCREENING BILAT W/ TOMO AND CAD
8 series · 8 of 24 positions shown · non-contrast
Comparison: Previous exam(s).

CLINICAL DATA: Screening.

EXAM:
DIGITAL SCREENING BILATERAL MAMMOGRAM WITH TOMO AND CAD

[R CC synth-2D]
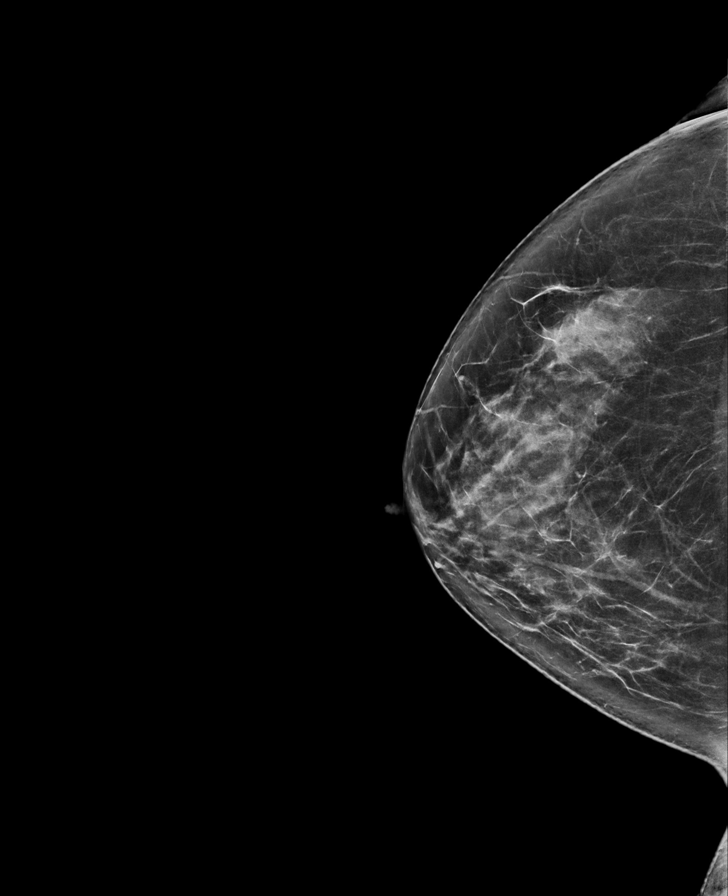

[L MLO synth-2D]
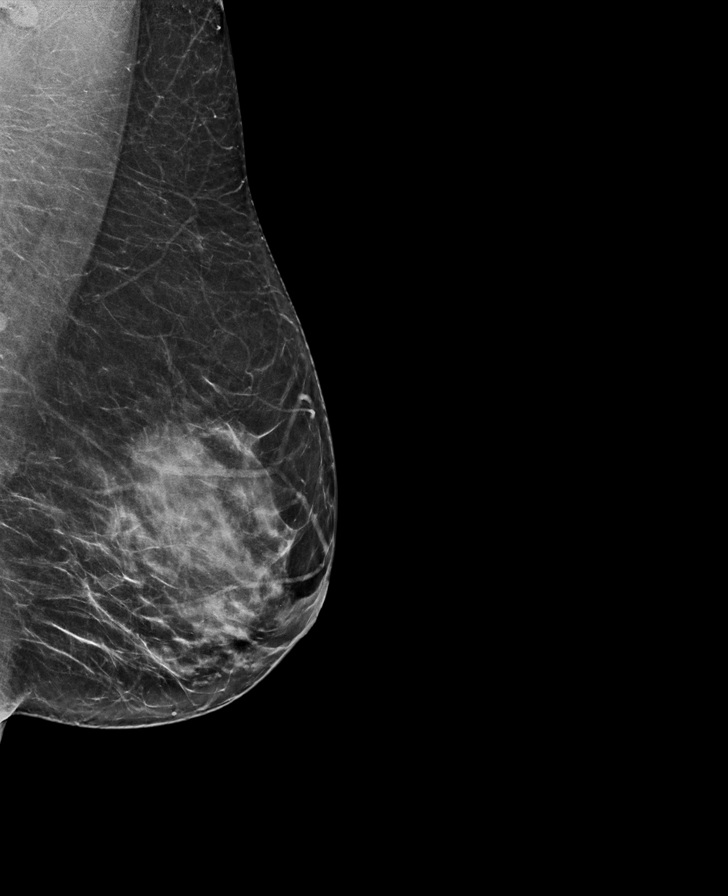

[L CC synth-2D]
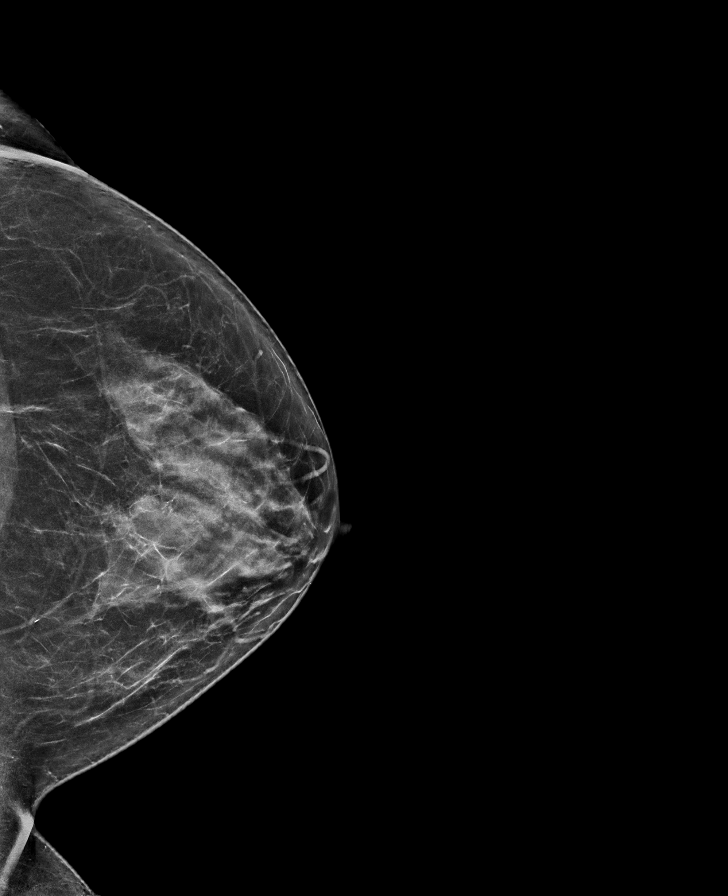

[R MLO synth-2D]
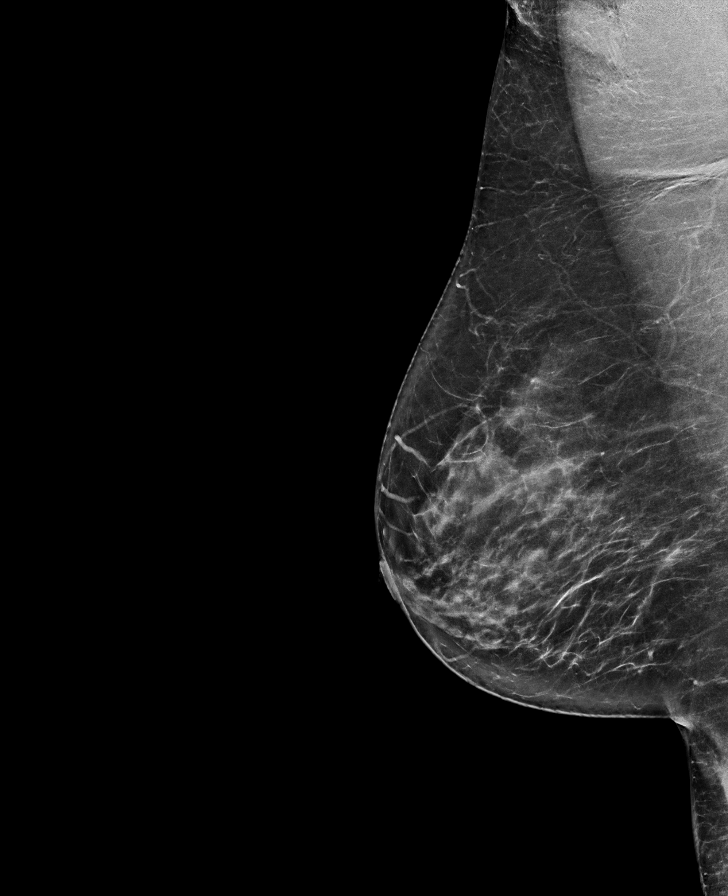

[R CC tomo · tomo slice 39/76.0]
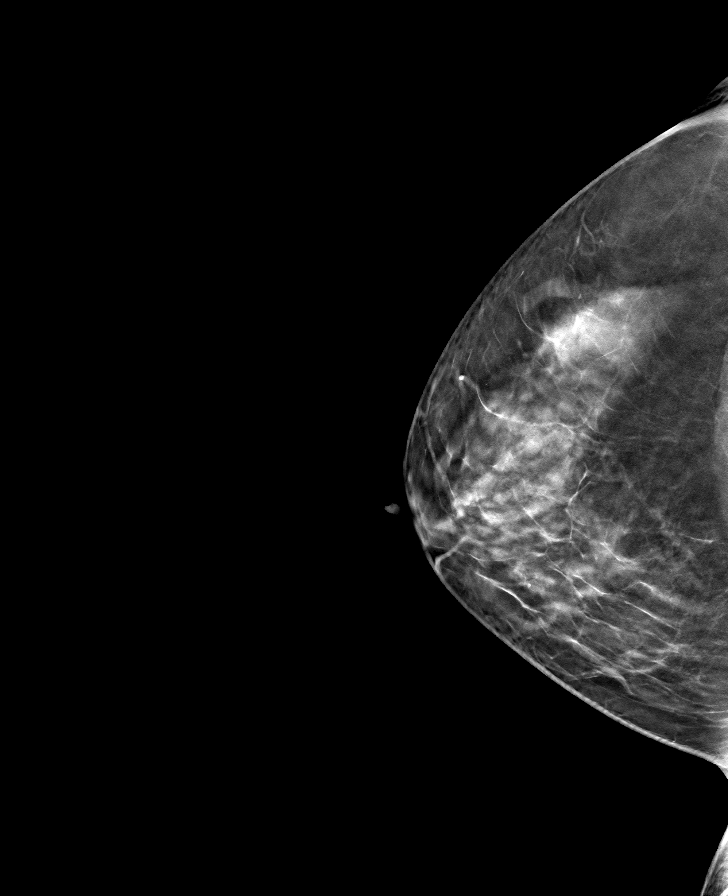

[L CC tomo · tomo slice 35/69.0]
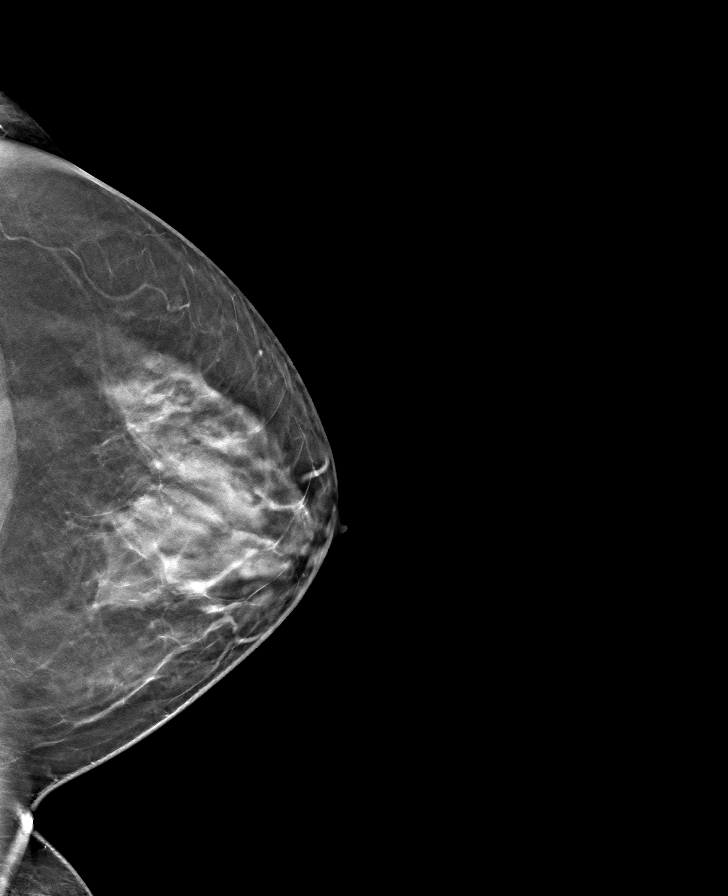

[R MLO tomo · tomo slice 38/75.0]
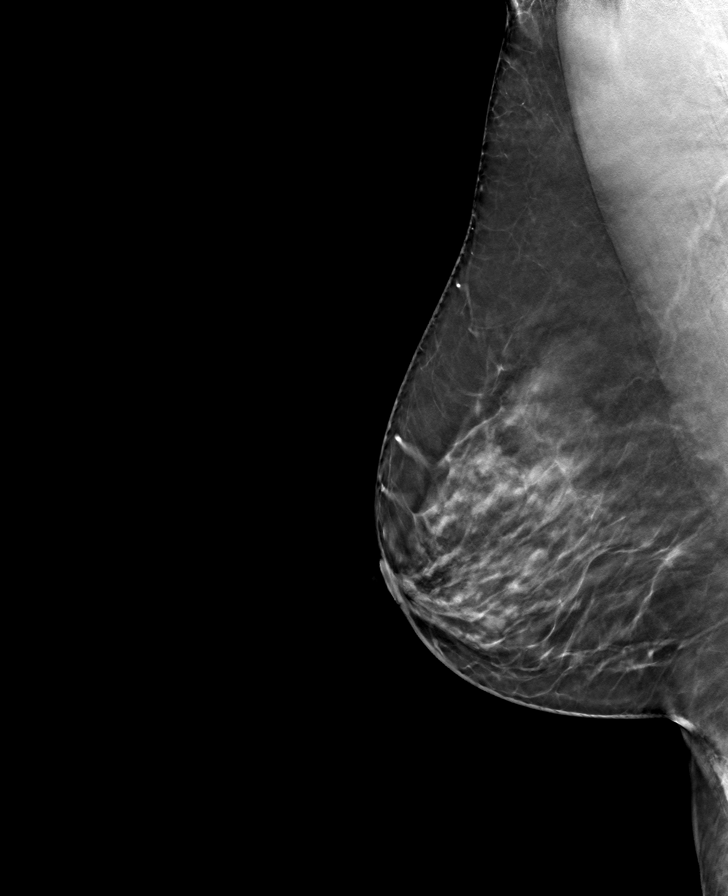

[L MLO tomo · tomo slice 35/69.0]
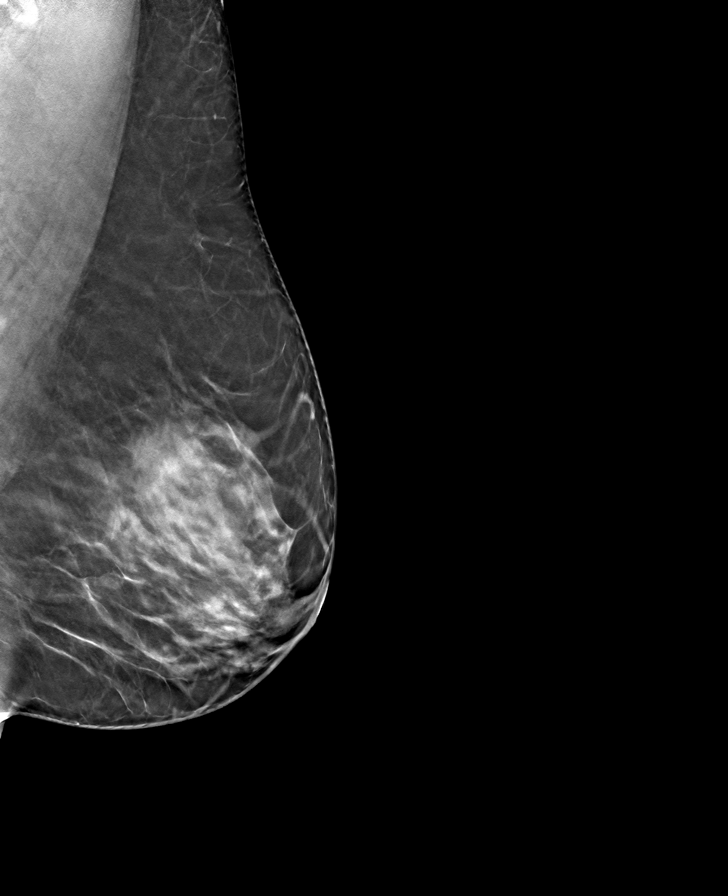

[8 of 24 positions shown; findings below may reference images not displayed]

ACR Breast Density Category c: The breast tissue is heterogeneously
dense, which may obscure small masses.
FINDINGS: There are no findings suspicious for malignancy. The images were
evaluated with computer-aided detection.
IMPRESSION: No mammographic evidence of malignancy. A result letter of this
screening mammogram will be mailed directly to the patient.

RECOMMENDATION:
Screening mammogram in one year. (Code:[id number])

BI-RADS CATEGORY  1: Negative.

## 2021-11-18 ENCOUNTER — Other Ambulatory Visit: Payer: Self-pay

## 2021-11-25 ENCOUNTER — Other Ambulatory Visit: Payer: Self-pay

## 2021-11-28 DIAGNOSIS — S60453A Superficial foreign body of left middle finger, initial encounter: Secondary | ICD-10-CM | POA: Insufficient documentation

## 2021-12-06 ENCOUNTER — Ambulatory Visit (INDEPENDENT_AMBULATORY_CARE_PROVIDER_SITE_OTHER): Payer: 59 | Admitting: Vascular Surgery

## 2021-12-06 ENCOUNTER — Encounter (INDEPENDENT_AMBULATORY_CARE_PROVIDER_SITE_OTHER): Payer: Self-pay | Admitting: Vascular Surgery

## 2021-12-06 ENCOUNTER — Other Ambulatory Visit: Payer: Self-pay

## 2021-12-06 VITALS — BP 108/73 | HR 67 | Resp 16 | Wt 159.6 lb

## 2021-12-06 DIAGNOSIS — I83811 Varicose veins of right lower extremities with pain: Secondary | ICD-10-CM

## 2021-12-06 NOTE — Progress Notes (Signed)
Erica Grant is a 66 y.o.female who presents with painful varicose veins of the right leg  Past Medical History:  Diagnosis Date   Adenomatous polyp    H/O urticaria    Hyperlipidemia    Hypothyroidism    Narrow angle glaucoma suspect    Osteopenia    Seasonal allergies    Varicose veins of lower extremity     Past Surgical History:  Procedure Laterality Date   COLONOSCOPY  04/11/2012   COLONOSCOPY WITH PROPOFOL N/A 05/30/2017   Procedure: COLONOSCOPY WITH PROPOFOL;  Surgeon: Scot Jun, MD;  Location: [facility] ENDOSCOPY;  Service: Endoscopy;  Laterality: N/A;   COLONOSCOPY WITH PROPOFOL N/A 06/14/2020   Procedure: COLONOSCOPY WITH PROPOFOL;  Surgeon: Regis Bill, MD;  Location: [facility] ENDOSCOPY;  Service: Endoscopy;  Laterality: N/A;   INCISION AND DRAINAGE WOUND WITH FOREIGN BODY REMOVAL Left 10/12/2021   Procedure: LEFT MIDDLE FINGER FOREIGN BODY REMOVAL;  Surgeon: Christena Flake, MD;  Location: [facility] ORS;  Service: Orthopedics;  Laterality: Left;    Current Outpatient Medications  Medication Sig Dispense Refill   levothyroxine (SYNTHROID) 100 MCG tablet TAKE 1 TABLET BY MOUTH EVERY MORNING (6:30AM) ON AN EMPTY STOMACH W/ GLASS OF WATER AT LEAST 30-60 MINUTES BEFORE BREAKFAST 90 tablet 3   mometasone (ELOCON) 0.1 % ointment Apply 1 application. topically daily as needed (rash).     mometasone (ELOCON) 0.1 % ointment Apply twice daily to affected areas until improved 45 g 0   ALPRAZolam (XANAX) 0.5 MG tablet Take 1 tablet by mouth 1 hour before procedure and 1 tablet when you arrive in office. (Patient not taking: Reported on 12/06/2021) 2 tablet 0   No current facility-administered medications for this visit.    Allergies  Allergen Reactions   Nickel Rash    Indication: Patient presents with symptomatic varicose veins of the right lower extremity.  Procedure: Initially, laser ablation was planned on the right leg.  Once we began to perform ultrasound on the leg,  it was clear that the vein in question was not the true great saphenous vein but an accessory saphenous vein which was very superficial and tortuous.  Despite attempts at access and wire placement, the vein was too superficial, small, and tortuous to allow laser ablation for treatment.  This vein joint what appeared to be the true great saphenous vein just a few centimeters from the saphenofemoral junction without room to consider laser ablation in this location.  I elected to perform foam sclerotherapy on this vein instead.  Foam sclerotherapy was performed on the right lower extremity. Using ultrasound guidance, 5 mL of foam Sotradecol was used to inject the varicosities of the right lower extremity. Compression wraps were placed. The patient tolerated the procedure well.

## 2021-12-14 ENCOUNTER — Encounter (INDEPENDENT_AMBULATORY_CARE_PROVIDER_SITE_OTHER): Payer: 59

## 2022-01-06 ENCOUNTER — Ambulatory Visit (INDEPENDENT_AMBULATORY_CARE_PROVIDER_SITE_OTHER): Payer: 59 | Admitting: Vascular Surgery

## 2022-01-11 DIAGNOSIS — E039 Hypothyroidism, unspecified: Secondary | ICD-10-CM | POA: Diagnosis not present

## 2022-01-13 ENCOUNTER — Ambulatory Visit (INDEPENDENT_AMBULATORY_CARE_PROVIDER_SITE_OTHER): Payer: 59 | Admitting: Vascular Surgery

## 2022-01-17 ENCOUNTER — Ambulatory Visit (INDEPENDENT_AMBULATORY_CARE_PROVIDER_SITE_OTHER): Payer: 59 | Admitting: Vascular Surgery

## 2022-01-17 VITALS — BP 122/77 | HR 69 | Resp 16 | Wt 159.0 lb

## 2022-01-17 DIAGNOSIS — I83811 Varicose veins of right lower extremities with pain: Secondary | ICD-10-CM | POA: Diagnosis not present

## 2022-01-17 DIAGNOSIS — E785 Hyperlipidemia, unspecified: Secondary | ICD-10-CM | POA: Diagnosis not present

## 2022-01-17 NOTE — Assessment & Plan Note (Signed)
lipid control important in reducing the progression of atherosclerotic disease.   

## 2022-01-17 NOTE — Assessment & Plan Note (Signed)
Patient was initially slated for laser ablation of the right great saphenous vein, but on interrogation this was a superficial tortuous branch that was not amenable to laser ablation was treated with foam sclerotherapy successfully.  She does have significant residual varicosities that can be addressed with saline sclerotherapy on the right leg but still because daily symptoms of burning, itching, and pain.  She desires to have these treated.  Risks and benefits are discussed and she is agreeable to proceed.

## 2022-01-17 NOTE — Progress Notes (Signed)
MRN : [id number]  Erica Grant is a 66 y.o. (1955/11/05) female who presents with chief complaint of  Chief Complaint  Patient presents with   Follow-up    4 week post laser follow up  .  History of Present Illness: Patient returns today in follow up of her varicose veins and pain.  About 6 weeks ago, she underwent foam sclerotherapy for a large superficial tortuous varicosity of the right lower extremity.  The initial plan was for laser ablation, but this vein was not amenable to that and the only patency of the true saphenous vein was very near the saphenofemoral junction.  She has done well from this.  She does have a lot of residual varicosities which are painful.  These cause burning, itching, and stinging pain almost every day.  She still requires anti-inflammatories for discomfort.  Current Outpatient Medications  Medication Sig Dispense Refill   levothyroxine (SYNTHROID) 100 MCG tablet TAKE 1 TABLET BY MOUTH EVERY MORNING (6:30AM) ON AN EMPTY STOMACH W/ GLASS OF WATER AT LEAST 30-60 MINUTES BEFORE BREAKFAST 90 tablet 3   mometasone (ELOCON) 0.1 % ointment Apply 1 application. topically daily as needed (rash).     mometasone (ELOCON) 0.1 % ointment Apply twice daily to affected areas until improved 45 g 0   ALPRAZolam (XANAX) 0.5 MG tablet Take 1 tablet by mouth 1 hour before procedure and 1 tablet when you arrive in office. (Patient not taking: Reported on 12/06/2021) 2 tablet 0   No current facility-administered medications for this visit.    Past Medical History:  Diagnosis Date   Adenomatous polyp    H/O urticaria    Hyperlipidemia    Hypothyroidism    Narrow angle glaucoma suspect    Osteopenia    Seasonal allergies    Varicose veins of lower extremity     Past Surgical History:  Procedure Laterality Date   COLONOSCOPY  04/11/2012   COLONOSCOPY WITH PROPOFOL N/A 05/30/2017   Procedure: COLONOSCOPY WITH PROPOFOL;  Surgeon: Manya Silvas, MD;  Location: [facility]  ENDOSCOPY;  Service: Endoscopy;  Laterality: N/A;   COLONOSCOPY WITH PROPOFOL N/A 06/14/2020   Procedure: COLONOSCOPY WITH PROPOFOL;  Surgeon: Lesly Rubenstein, MD;  Location: [facility] ENDOSCOPY;  Service: Endoscopy;  Laterality: N/A;   INCISION AND DRAINAGE WOUND WITH FOREIGN BODY REMOVAL Left 10/12/2021   Procedure: LEFT MIDDLE FINGER FOREIGN BODY REMOVAL;  Surgeon: Corky Mull, MD;  Location: [facility] ORS;  Service: Orthopedics;  Laterality: Left;     Social History   Tobacco Use   Smoking status: Former    Packs/day: 1.00    Years: 10.00    Total pack years: 10.00    Types: Cigarettes    Quit date: 1973    Years since quitting: 50.6   Smokeless tobacco: Never  Vaping Use   Vaping Use: Never used  Substance Use Topics   Alcohol use: Yes    Comment: occ.   Drug use: Never      Family History  Problem Relation Age of Onset   Varicose Veins Mother    Heart attack Mother    Cancer Brother    Breast cancer Neg Hx      Allergies  Allergen Reactions   Nickel Rash     REVIEW OF SYSTEMS (Negative unless checked)  Constitutional: '[]'$ Weight loss  '[]'$ Fever  '[]'$ Chills Cardiac: '[]'$ Chest pain   '[]'$ Chest pressure   '[]'$ Palpitations   '[]'$ Shortness of breath when laying flat   '[]'$ Shortness of breath  at rest   '[]'$ Shortness of breath with exertion. Vascular:  '[]'$ Pain in legs with walking   '[]'$ Pain in legs at rest   '[]'$ Pain in legs when laying flat   '[]'$ Claudication   '[]'$ Pain in feet when walking  '[]'$ Pain in feet at rest  '[]'$ Pain in feet when laying flat   '[]'$ History of DVT   '[]'$ Phlebitis   '[]'$ Swelling in legs   '[]'$ Varicose veins   '[]'$ Non-healing ulcers Pulmonary:   '[]'$ Uses home oxygen   '[]'$ Productive cough   '[]'$ Hemoptysis   '[]'$ Wheeze  '[]'$ COPD   '[]'$ Asthma Neurologic:  '[]'$ Dizziness  '[]'$ Blackouts   '[]'$ Seizures   '[]'$ History of stroke   '[]'$ History of TIA  '[]'$ Aphasia   '[]'$ Temporary blindness   '[]'$ Dysphagia   '[]'$ Weakness or numbness in arms   '[]'$ Weakness or numbness in legs Musculoskeletal:  '[]'$ Arthritis   '[]'$ Joint swelling   '[]'$ Joint  pain   '[]'$ Low back pain Hematologic:  '[]'$ Easy bruising  '[]'$ Easy bleeding   '[]'$ Hypercoagulable state   '[]'$ Anemic   Gastrointestinal:  '[]'$ Blood in stool   '[]'$ Vomiting blood  '[]'$ Gastroesophageal reflux/heartburn   '[]'$ Abdominal pain Genitourinary:  '[]'$ Chronic kidney disease   '[]'$ Difficult urination  '[]'$ Frequent urination  '[]'$ Burning with urination   '[]'$ Hematuria Skin:  '[]'$ Rashes   '[]'$ Ulcers   '[]'$ Wounds Psychological:  '[]'$ History of anxiety   '[]'$  History of major depression.  Physical Examination  BP 122/77 (BP Location: Right Arm)   Pulse 69   Resp 16   Wt 159 lb (72.1 kg)   BMI 29.08 kg/m  Gen:  WD/WN, NAD Head: [place]/AT, No temporalis wasting. Ear/Nose/Throat: Hearing grossly intact, nares w/o erythema or drainage Eyes: Conjunctiva clear. Sclera non-icteric Neck: Supple.  Trachea midline Pulmonary:  Good air movement, no use of accessory muscles.  Cardiac: RRR, no JVD Vascular: Diffuse varicosities present in the right medial lower thigh, knee, and calf area as well as around the foot and ankle.  A firm area of scar is present associated with the previous vein treated with foam sclerotherapy in the upper thigh. Vessel Right Left  Radial Palpable Palpable                          PT Palpable Palpable  DP Palpable Palpable   Musculoskeletal: M/S 5/5 throughout.  No deformity or atrophy.  Trace right lower extremity edema. Neurologic: Sensation grossly intact in extremities.  Symmetrical.  Speech is fluent.  Psychiatric: Judgment intact, Mood & affect appropriate for pt's clinical situation. Dermatologic: No rashes or ulcers noted.  No cellulitis or open wounds.      Labs No results found for this or any previous visit (from the past 2160 hour(s)).  Radiology No results found.  Assessment/Plan  Varicose veins of lower extremity with pain, right Patient was initially slated for laser ablation of the right great saphenous vein, but on interrogation this was a superficial tortuous branch that  was not amenable to laser ablation was treated with foam sclerotherapy successfully.  She does have significant residual varicosities that can be addressed with saline sclerotherapy on the right leg but still because daily symptoms of burning, itching, and pain.  She desires to have these treated.  Risks and benefits are discussed and she is agreeable to proceed.  Hyperlipidemia lipid control important in reducing the progression of atherosclerotic disease.     Leotis Pain, MD  01/17/2022 9:23 AM    This note was created with Dragon medical transcription system.  Any errors from dictation are purely unintentional

## 2022-02-06 DIAGNOSIS — S60453A Superficial foreign body of left middle finger, initial encounter: Secondary | ICD-10-CM | POA: Diagnosis not present

## 2022-02-16 DIAGNOSIS — H524 Presbyopia: Secondary | ICD-10-CM | POA: Diagnosis not present

## 2022-03-06 ENCOUNTER — Other Ambulatory Visit: Payer: Self-pay

## 2022-03-09 ENCOUNTER — Telehealth (INDEPENDENT_AMBULATORY_CARE_PROVIDER_SITE_OTHER): Payer: Self-pay | Admitting: Vascular Surgery

## 2022-03-09 NOTE — Telephone Encounter (Signed)
Spoke with pt to make sclerotherapy appts. Patient states that she wants to call insurance and see how much out of pocket she will have to pay. I attempted to give informtion and she states that she is at work and can't talk but will call back as soon as she can. Nothing further needed at this time.   right leg SALINE sclero. no auth req. SEE FB

## 2022-03-14 ENCOUNTER — Other Ambulatory Visit: Payer: Self-pay

## 2022-03-14 DIAGNOSIS — L851 Acquired keratosis [keratoderma] palmaris et plantaris: Secondary | ICD-10-CM | POA: Diagnosis not present

## 2022-03-14 DIAGNOSIS — L239 Allergic contact dermatitis, unspecified cause: Secondary | ICD-10-CM | POA: Diagnosis not present

## 2022-03-14 MED ORDER — MOMETASONE FUROATE 0.1 % EX OINT
TOPICAL_OINTMENT | CUTANEOUS | 2 refills | Status: AC
  Filled 2022-03-14: qty 45, 30d supply, fill #0
  Filled 2022-06-08 – 2022-06-22 (×3): qty 45, 30d supply, fill #1

## 2022-05-16 ENCOUNTER — Ambulatory Visit (INDEPENDENT_AMBULATORY_CARE_PROVIDER_SITE_OTHER): Payer: 59 | Admitting: Nurse Practitioner

## 2022-05-16 VITALS — BP 121/80 | HR 75 | Resp 17 | Ht 64.0 in | Wt 158.0 lb

## 2022-05-16 DIAGNOSIS — I83811 Varicose veins of right lower extremities with pain: Secondary | ICD-10-CM | POA: Diagnosis not present

## 2022-05-28 ENCOUNTER — Encounter (INDEPENDENT_AMBULATORY_CARE_PROVIDER_SITE_OTHER): Payer: Self-pay | Admitting: Nurse Practitioner

## 2022-05-28 NOTE — Progress Notes (Signed)
Varicose veins of right  lower extremity with inflammation (454.1  I83.10) Current Plans   Indication: Patient presents with symptomatic varicose veins of the right  lower extremity.   Procedure: Sclerotherapy using hypertonic saline mixed with 1% Lidocaine was performed on the right lower extremity. Compression wraps were placed. The patient tolerated the procedure well. 

## 2022-05-29 ENCOUNTER — Other Ambulatory Visit: Payer: Self-pay | Admitting: Family Medicine

## 2022-05-29 DIAGNOSIS — Z1231 Encounter for screening mammogram for malignant neoplasm of breast: Secondary | ICD-10-CM

## 2022-05-31 ENCOUNTER — Other Ambulatory Visit: Payer: Self-pay

## 2022-05-31 MED ORDER — LEVOTHYROXINE SODIUM 100 MCG PO TABS
ORAL_TABLET | ORAL | 3 refills | Status: AC
  Filled 2022-05-31: qty 90, 90d supply, fill #0
  Filled 2022-09-08: qty 90, 90d supply, fill #1
  Filled 2022-12-06: qty 90, 90d supply, fill #2
  Filled 2023-03-05: qty 90, 90d supply, fill #3

## 2022-06-07 ENCOUNTER — Ambulatory Visit (INDEPENDENT_AMBULATORY_CARE_PROVIDER_SITE_OTHER): Payer: Medicare Other | Admitting: Nurse Practitioner

## 2022-06-20 ENCOUNTER — Other Ambulatory Visit: Payer: Self-pay

## 2022-06-22 ENCOUNTER — Other Ambulatory Visit: Payer: Self-pay

## 2022-07-06 ENCOUNTER — Ambulatory Visit
Admission: RE | Admit: 2022-07-06 | Discharge: 2022-07-06 | Disposition: A | Discharge status: Home / Self Care | Payer: Commercial Managed Care - PPO | Source: Ambulatory Visit | Attending: Family Medicine | Admitting: Family Medicine

## 2022-07-06 DIAGNOSIS — Z1231 Encounter for screening mammogram for malignant neoplasm of breast: Secondary | ICD-10-CM | POA: Insufficient documentation

## 2022-07-19 DIAGNOSIS — Z13220 Encounter for screening for lipoid disorders: Secondary | ICD-10-CM | POA: Diagnosis not present

## 2022-07-19 DIAGNOSIS — Z0000 Encounter for general adult medical examination without abnormal findings: Secondary | ICD-10-CM | POA: Diagnosis not present

## 2022-07-26 ENCOUNTER — Other Ambulatory Visit: Payer: Self-pay

## 2022-07-26 DIAGNOSIS — E039 Hypothyroidism, unspecified: Secondary | ICD-10-CM | POA: Diagnosis not present

## 2022-07-26 DIAGNOSIS — Z0000 Encounter for general adult medical examination without abnormal findings: Secondary | ICD-10-CM | POA: Diagnosis not present

## 2022-07-26 DIAGNOSIS — Z23 Encounter for immunization: Secondary | ICD-10-CM | POA: Diagnosis not present

## 2022-07-26 DIAGNOSIS — M8580 Other specified disorders of bone density and structure, unspecified site: Secondary | ICD-10-CM | POA: Diagnosis not present

## 2022-08-30 ENCOUNTER — Other Ambulatory Visit: Payer: Self-pay

## 2022-09-08 ENCOUNTER — Other Ambulatory Visit: Payer: Self-pay

## 2022-11-02 ENCOUNTER — Other Ambulatory Visit: Payer: Self-pay

## 2022-11-02 MED ORDER — PNEUMOCOCCAL 20-VAL CONJ VACC 0.5 ML IM SUSY
0.5000 mL | PREFILLED_SYRINGE | Freq: Once | INTRAMUSCULAR | 0 refills | Status: AC
  Filled 2022-11-02 – 2022-12-22 (×2): qty 0.5, 1d supply, fill #0

## 2022-11-13 ENCOUNTER — Other Ambulatory Visit: Payer: Self-pay

## 2022-12-06 ENCOUNTER — Other Ambulatory Visit: Payer: Self-pay

## 2022-12-22 ENCOUNTER — Other Ambulatory Visit: Payer: Self-pay

## 2023-01-11 ENCOUNTER — Other Ambulatory Visit: Payer: Self-pay

## 2023-03-05 ENCOUNTER — Other Ambulatory Visit: Payer: Self-pay

## 2023-03-20 DIAGNOSIS — L815 Leukoderma, not elsewhere classified: Secondary | ICD-10-CM | POA: Diagnosis not present

## 2023-03-20 DIAGNOSIS — D2261 Melanocytic nevi of right upper limb, including shoulder: Secondary | ICD-10-CM | POA: Diagnosis not present

## 2023-03-20 DIAGNOSIS — D2262 Melanocytic nevi of left upper limb, including shoulder: Secondary | ICD-10-CM | POA: Diagnosis not present

## 2023-03-20 DIAGNOSIS — D225 Melanocytic nevi of trunk: Secondary | ICD-10-CM | POA: Diagnosis not present

## 2023-03-20 DIAGNOSIS — D2271 Melanocytic nevi of right lower limb, including hip: Secondary | ICD-10-CM | POA: Diagnosis not present

## 2023-03-20 DIAGNOSIS — D2272 Melanocytic nevi of left lower limb, including hip: Secondary | ICD-10-CM | POA: Diagnosis not present

## 2023-03-20 DIAGNOSIS — L820 Inflamed seborrheic keratosis: Secondary | ICD-10-CM | POA: Diagnosis not present

## 2023-03-20 DIAGNOSIS — L538 Other specified erythematous conditions: Secondary | ICD-10-CM | POA: Diagnosis not present

## 2023-05-21 ENCOUNTER — Other Ambulatory Visit: Payer: Self-pay

## 2023-05-21 MED ORDER — AMOXICILLIN 500 MG PO CAPS
500.0000 mg | ORAL_CAPSULE | ORAL | 0 refills | Status: AC
  Filled 2023-05-21: qty 22, 7d supply, fill #0

## 2023-05-29 ENCOUNTER — Other Ambulatory Visit: Payer: Self-pay | Admitting: Family Medicine

## 2023-05-29 DIAGNOSIS — Z1231 Encounter for screening mammogram for malignant neoplasm of breast: Secondary | ICD-10-CM

## 2023-06-08 ENCOUNTER — Other Ambulatory Visit: Payer: Self-pay

## 2023-06-08 MED ORDER — LEVOTHYROXINE SODIUM 100 MCG PO TABS
100.0000 ug | ORAL_TABLET | Freq: Every day | ORAL | 3 refills | Status: AC
  Filled 2023-06-08: qty 90, 90d supply, fill #0
  Filled 2023-09-09: qty 90, 90d supply, fill #1
  Filled 2023-11-02: qty 30, 30d supply, fill #2
  Filled 2024-01-08: qty 30, 30d supply, fill #3
  Filled 2024-01-09: qty 90, 90d supply, fill #3
  Filled 2024-04-07: qty 60, 60d supply, fill #4

## 2023-07-24 DIAGNOSIS — M8580 Other specified disorders of bone density and structure, unspecified site: Secondary | ICD-10-CM | POA: Diagnosis not present

## 2023-07-24 DIAGNOSIS — Z0000 Encounter for general adult medical examination without abnormal findings: Secondary | ICD-10-CM | POA: Diagnosis not present

## 2023-07-24 DIAGNOSIS — E039 Hypothyroidism, unspecified: Secondary | ICD-10-CM | POA: Diagnosis not present

## 2023-07-24 DIAGNOSIS — Z13220 Encounter for screening for lipoid disorders: Secondary | ICD-10-CM | POA: Diagnosis not present

## 2023-07-25 ENCOUNTER — Ambulatory Visit
Admission: RE | Admit: 2023-07-25 | Discharge: 2023-07-25 | Disposition: A | Discharge status: Home / Self Care | Payer: Commercial Managed Care - PPO | Source: Ambulatory Visit | Attending: Family Medicine | Admitting: Family Medicine

## 2023-07-25 DIAGNOSIS — Z1231 Encounter for screening mammogram for malignant neoplasm of breast: Secondary | ICD-10-CM | POA: Insufficient documentation

## 2023-07-30 DIAGNOSIS — Z0000 Encounter for general adult medical examination without abnormal findings: Secondary | ICD-10-CM | POA: Diagnosis not present

## 2023-07-30 DIAGNOSIS — R82998 Other abnormal findings in urine: Secondary | ICD-10-CM | POA: Diagnosis not present

## 2023-08-27 ENCOUNTER — Other Ambulatory Visit: Payer: Self-pay

## 2023-08-27 DIAGNOSIS — J029 Acute pharyngitis, unspecified: Secondary | ICD-10-CM | POA: Diagnosis not present

## 2023-08-27 DIAGNOSIS — J309 Allergic rhinitis, unspecified: Secondary | ICD-10-CM | POA: Diagnosis not present

## 2023-08-27 DIAGNOSIS — R519 Headache, unspecified: Secondary | ICD-10-CM | POA: Diagnosis not present

## 2023-08-27 MED ORDER — PREDNISONE 20 MG PO TABS
20.0000 mg | ORAL_TABLET | Freq: Every day | ORAL | 0 refills | Status: AC
  Filled 2023-08-27: qty 5, 5d supply, fill #0

## 2023-10-18 DIAGNOSIS — H40033 Anatomical narrow angle, bilateral: Secondary | ICD-10-CM | POA: Diagnosis not present

## 2023-10-18 DIAGNOSIS — H2513 Age-related nuclear cataract, bilateral: Secondary | ICD-10-CM | POA: Diagnosis not present

## 2023-11-02 ENCOUNTER — Other Ambulatory Visit: Payer: Self-pay

## 2024-01-09 ENCOUNTER — Other Ambulatory Visit: Payer: Self-pay

## 2024-04-07 ENCOUNTER — Other Ambulatory Visit: Payer: Self-pay

## 2024-04-07 MED ORDER — LEVOTHYROXINE SODIUM 100 MCG PO TABS
100.0000 ug | ORAL_TABLET | Freq: Every day | ORAL | 0 refills | Status: AC
  Filled 2024-04-07 – 2024-06-10 (×2): qty 90, 90d supply, fill #0

## 2024-06-06 ENCOUNTER — Other Ambulatory Visit: Payer: Self-pay

## 2024-06-10 ENCOUNTER — Other Ambulatory Visit: Payer: Self-pay

## 2024-06-16 ENCOUNTER — Other Ambulatory Visit: Payer: Self-pay

## 2024-06-17 DIAGNOSIS — Z1231 Encounter for screening mammogram for malignant neoplasm of breast: Secondary | ICD-10-CM

## 2024-07-28 ENCOUNTER — Encounter
